# Patient Record
Sex: Female | Born: 1965 | Race: Black or African American | Hispanic: No | Marital: Married | Smoking: Never smoker
Health system: Southern US, Community
[De-identification: ages and names within clinical notes are randomized; demographics above are authoritative.]

## PROBLEM LIST (undated history)

## (undated) DIAGNOSIS — F419 Anxiety disorder, unspecified: Secondary | ICD-10-CM

## (undated) DIAGNOSIS — G43909 Migraine, unspecified, not intractable, without status migrainosus: Secondary | ICD-10-CM

## (undated) DIAGNOSIS — T783XXA Angioneurotic edema, initial encounter: Secondary | ICD-10-CM

## (undated) DIAGNOSIS — I10 Essential (primary) hypertension: Secondary | ICD-10-CM

## (undated) DIAGNOSIS — M7501 Adhesive capsulitis of right shoulder: Secondary | ICD-10-CM

## (undated) DIAGNOSIS — N819 Female genital prolapse, unspecified: Secondary | ICD-10-CM

## (undated) DIAGNOSIS — N3281 Overactive bladder: Secondary | ICD-10-CM

## (undated) DIAGNOSIS — F988 Other specified behavioral and emotional disorders with onset usually occurring in childhood and adolescence: Secondary | ICD-10-CM

## (undated) DIAGNOSIS — N393 Stress incontinence (female) (male): Secondary | ICD-10-CM

## (undated) DIAGNOSIS — M67911 Unspecified disorder of synovium and tendon, right shoulder: Secondary | ICD-10-CM

## (undated) DIAGNOSIS — R7303 Prediabetes: Secondary | ICD-10-CM

## (undated) HISTORY — PX: ABDOMINAL HYSTERECTOMY: SHX81

## (undated) HISTORY — DX: Angioneurotic edema, initial encounter: T78.3XXA

## (undated) HISTORY — PX: BUNIONECTOMY: SHX129

## (undated) HISTORY — PX: VAGINAL HYSTERECTOMY: SUR661

## (undated) HISTORY — PX: ABDOMINOPLASTY: SUR9

## (undated) HISTORY — DX: Migraine, unspecified, not intractable, without status migrainosus: G43.909

## (undated) HISTORY — PX: TONSILLECTOMY: SUR1361

## (undated) HISTORY — DX: Essential (primary) hypertension: I10

---

## 1987-07-13 HISTORY — PX: BREAST REDUCTION SURGERY: SHX8

## 1988-07-12 HISTORY — PX: VAGINAL HYSTERECTOMY: SUR661

## 1993-07-12 HISTORY — PX: BUNIONECTOMY: SHX129

## 1994-07-12 HISTORY — PX: TUBAL LIGATION: SHX77

## 1997-11-19 ENCOUNTER — Other Ambulatory Visit: Admission: RE | Admit: 1997-11-19 | Discharge: 1997-11-19 | Payer: Self-pay | Admitting: Obstetrics and Gynecology

## 1998-04-01 ENCOUNTER — Encounter: Admission: RE | Admit: 1998-04-01 | Discharge: 1998-04-01 | Payer: Self-pay | Admitting: Family Medicine

## 2003-04-03 ENCOUNTER — Inpatient Hospital Stay (HOSPITAL_COMMUNITY): Admission: RE | Admit: 2003-04-03 | Discharge: 2003-04-05 | Payer: Self-pay | Admitting: Obstetrics and Gynecology

## 2006-06-09 ENCOUNTER — Encounter: Admission: RE | Admit: 2006-06-09 | Discharge: 2006-06-09 | Payer: Self-pay | Admitting: Family Medicine

## 2008-07-23 LAB — HM MAMMOGRAPHY: HM Mammogram: NORMAL

## 2009-09-30 LAB — HM PAP SMEAR: HM Pap smear: NORMAL

## 2010-08-01 ENCOUNTER — Encounter: Payer: Self-pay | Admitting: Family Medicine

## 2010-12-15 ENCOUNTER — Other Ambulatory Visit: Payer: Self-pay | Admitting: Obstetrics and Gynecology

## 2010-12-15 ENCOUNTER — Other Ambulatory Visit (HOSPITAL_COMMUNITY)
Admission: RE | Admit: 2010-12-15 | Discharge: 2010-12-15 | Disposition: A | Discharge status: Home / Self Care | Payer: BC Managed Care – PPO | Source: Ambulatory Visit | Attending: Obstetrics and Gynecology | Admitting: Obstetrics and Gynecology

## 2010-12-15 DIAGNOSIS — Z01419 Encounter for gynecological examination (general) (routine) without abnormal findings: Secondary | ICD-10-CM | POA: Insufficient documentation

## 2011-01-20 ENCOUNTER — Encounter: Payer: Self-pay | Admitting: Family Medicine

## 2011-12-15 ENCOUNTER — Other Ambulatory Visit (HOSPITAL_COMMUNITY)
Admission: RE | Admit: 2011-12-15 | Discharge: 2011-12-15 | Disposition: A | Discharge status: Home / Self Care | Payer: BC Managed Care – PPO | Source: Ambulatory Visit | Attending: Obstetrics and Gynecology | Admitting: Obstetrics and Gynecology

## 2011-12-15 ENCOUNTER — Other Ambulatory Visit: Payer: Self-pay | Admitting: Obstetrics and Gynecology

## 2011-12-15 DIAGNOSIS — Z01419 Encounter for gynecological examination (general) (routine) without abnormal findings: Secondary | ICD-10-CM | POA: Insufficient documentation

## 2011-12-15 DIAGNOSIS — Z1159 Encounter for screening for other viral diseases: Secondary | ICD-10-CM | POA: Insufficient documentation

## 2012-09-13 ENCOUNTER — Other Ambulatory Visit: Payer: Self-pay | Admitting: Radiology

## 2013-04-12 ENCOUNTER — Other Ambulatory Visit (HOSPITAL_COMMUNITY)
Admission: RE | Admit: 2013-04-12 | Discharge: 2013-04-12 | Disposition: A | Discharge status: Home / Self Care | Payer: BC Managed Care – PPO | Source: Ambulatory Visit | Attending: Obstetrics and Gynecology | Admitting: Obstetrics and Gynecology

## 2013-04-12 ENCOUNTER — Other Ambulatory Visit: Payer: Self-pay | Admitting: Obstetrics and Gynecology

## 2013-04-12 DIAGNOSIS — Z01419 Encounter for gynecological examination (general) (routine) without abnormal findings: Secondary | ICD-10-CM | POA: Insufficient documentation

## 2013-10-31 ENCOUNTER — Other Ambulatory Visit: Payer: Self-pay | Admitting: Physician Assistant

## 2013-10-31 ENCOUNTER — Ambulatory Visit
Admission: RE | Admit: 2013-10-31 | Discharge: 2013-10-31 | Disposition: A | Discharge status: Home / Self Care | Payer: BC Managed Care – PPO | Source: Ambulatory Visit | Attending: Physician Assistant | Admitting: Physician Assistant

## 2013-10-31 DIAGNOSIS — S99911A Unspecified injury of right ankle, initial encounter: Secondary | ICD-10-CM

## 2014-04-04 ENCOUNTER — Ambulatory Visit: Payer: BC Managed Care – PPO | Admitting: Neurology

## 2014-04-04 ENCOUNTER — Telehealth: Payer: Self-pay | Admitting: Neurology

## 2014-04-04 ENCOUNTER — Encounter: Payer: Self-pay | Admitting: *Deleted

## 2014-04-04 NOTE — Telephone Encounter (Signed)
We can reschedule

## 2014-04-04 NOTE — Progress Notes (Signed)
No show letter sent for 04-03-14

## 2014-04-04 NOTE — Telephone Encounter (Signed)
Pt totally forgot about her new patient appt today and called at 9:14 and wanted to know if she still come in. i told her that we would need to see if we could resch please advise If this is ok to resch   [phone]

## 2014-04-04 NOTE — Telephone Encounter (Signed)
Pt no showed appt 04-04-14 and referring dr was notified by phone call

## 2014-05-09 ENCOUNTER — Encounter: Payer: Self-pay | Admitting: *Deleted

## 2014-05-14 ENCOUNTER — Ambulatory Visit (INDEPENDENT_AMBULATORY_CARE_PROVIDER_SITE_OTHER): Payer: BC Managed Care – PPO | Admitting: Neurology

## 2014-05-14 ENCOUNTER — Encounter: Payer: Self-pay | Admitting: Neurology

## 2014-05-14 VITALS — BP 112/72 | HR 84 | Ht 65.5 in | Wt 140.0 lb

## 2014-05-14 DIAGNOSIS — G4441 Drug-induced headache, not elsewhere classified, intractable: Secondary | ICD-10-CM

## 2014-05-14 DIAGNOSIS — G4440 Drug-induced headache, not elsewhere classified, not intractable: Secondary | ICD-10-CM

## 2014-05-14 DIAGNOSIS — G43709 Chronic migraine without aura, not intractable, without status migrainosus: Secondary | ICD-10-CM

## 2014-05-14 MED ORDER — PROPRANOLOL HCL ER 60 MG PO CP24
60.0000 mg | ORAL_CAPSULE | Freq: Every day | ORAL | Status: DC
Start: 2014-05-14 — End: 2018-03-08

## 2014-05-14 NOTE — Patient Instructions (Signed)
1.  We will start Inderal LA 60mg  daily.  Call in 4 weeks with update. 2.  Continue the topamax 200mg  for now. 3.  We will slowly taper down on the Excedrin:  Take no more than 5 days out of the week for 7 days  Then no more than 4 days out of the week for 7 days  Then no more than 3 days out of the week for 7 days  Then no more than 2 days out of the week 4.  Keep headache diary 5.  Follow the exercises described on the sleep hygiene sheet. 6.  Follow up in 3 months.  Call in 4 weeks with update of headaches and we can adjust the Inderal if needed.

## 2014-05-14 NOTE — Progress Notes (Signed)
NEUROLOGY CONSULTATION NOTE  [name] MRN: [id number] DOB: 01/12/1966  Referring provider: Dr. Clelia Croft Primary care provider: Dr. Clelia Croft  Reason for consult:  Headache  HISTORY OF PRESENT ILLNESS: Leah Khan is a 48 year old right-handed woman with history of hypertension, depression and headache who presents for migraines.  Onset:  High school Location:  Varies.  Usually bi-temporal or bi-frontal but sometimes also in the back of the head Quality:  pounding Intensity:  7-10/10 Aura:  no Prodrome:  no Associated symptoms:  Nausea, photophobia, phonophobia, osmophobia.  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)  Past abortive therapy:  Migranal NS, Cambia, Tylenol, sumatriptan, Maxalt, Frova, Midrin, Zomig po and NS, ibuprofen, naproxen, diclofenac, baclofen, cyclobenzaprine, Skelaxin, tizanidine, metoclopramide, tramadol Past preventative therapy:  zonisamide 100mg , gabapentin, atenolol, Cymbalta, imipramine, Zoloft (side effects), amitriptyline (side effects), trigger point injections  Current abortive therapy:  Excedrin (takes 20 days per month) Current preventative therapy:  topiramate 200mg   Other medications:  Ambien 0.25 of 12.5mg  tablet, lisinopril  Caffeine:  no Alcohol:  no Smoker:  no Diet:  Increased water.  No pork or soda.  Little sweets Exercise:  routinely Depression/stress:  no Sleep hygiene:  Poor.  Wakes up and cannot fall back to sleep.  Sleeps well with 1/4 tablet of Ambien Family history of headache:  Maternal grandfather  06/09/06 MRI BRAIN W/WO:  right frontal venous angioma and punctate focus of hyperintensity in the  left parietal white matter, both incidental findings.  PAST MEDICAL HISTORY: Past Medical History  Diagnosis Date  . Migraine   . Hypertension     PAST SURGICAL HISTORY: Past Surgical History  Procedure Laterality Date  . Abdominal hysterectomy    . Bunionectomy      MEDICATIONS: Current Outpatient Prescriptions on File Prior to Visit  Medication Sig Dispense Refill  . lisinopril-hydrochlorothiazide (PRINZIDE,ZESTORETIC) 20-12.5 MG per tablet Take 1 tablet by mouth daily.      Marland Kitchen topiramate (TOPAMAX) 200 MG tablet Take 200 mg by mouth daily.     Marland Kitchen zolpidem (AMBIEN CR) 12.5 MG CR tablet Take 12.5 mg by mouth at bedtime as needed.       No current facility-administered medications on file prior to visit.    ALLERGIES: Allergies  Allergen Reactions  . Amitriptyline     Irritability   . Sertraline Hcl     Zombie feeling     FAMILY HISTORY: History reviewed. No pertinent family history.  SOCIAL HISTORY: History   Social History  . Marital Status: Married    Spouse Name: N/A    Number of Children: N/A  . Years of Education: N/A   Occupational History  . Not on file.   Social History Main Topics  . Smoking status: Never Smoker   . Smokeless tobacco: Not on file  . Alcohol Use: No  . Drug Use: No  . Sexual Activity: Not on file   Other Topics Concern  . Not on file   Social History Narrative    REVIEW OF SYSTEMS: Constitutional: No fevers, chills, or sweats, no generalized fatigue, change in appetite Eyes: No visual changes, double vision, eye pain Ear, nose and throat: No hearing loss, ear  pain, nasal congestion, sore throat Cardiovascular: No chest pain, palpitations Respiratory:  No shortness of breath at rest or with exertion, wheezes GastrointestinaI: No nausea, vomiting, diarrhea, abdominal pain, fecal incontinence Genitourinary:  No dysuria, urinary retention or frequency Musculoskeletal:  No neck pain, back pain Integumentary: No rash, pruritus,  skin lesions Neurological: as above Psychiatric: Insomnia Endocrine: No palpitations, fatigue, diaphoresis, mood swings, change in appetite, change in weight, increased thirst Hematologic/Lymphatic:  No anemia, purpura, petechiae. Allergic/Immunologic: no itchy/runny eyes, nasal congestion, recent allergic reactions, rashes  PHYSICAL EXAM: Filed Vitals:   05/14/14 1244  BP: 112/72  Pulse: 84   General: No acute distress Head:  Normocephalic/atraumatic Neck: supple, no paraspinal tenderness, full range of motion Back: No paraspinal tenderness Heart: regular rate and rhythm Lungs: Clear to auscultation bilaterally. Vascular: No carotid bruits. Neurological Exam: Mental status: alert and oriented to person, place, and time, recent and remote memory intact, fund of knowledge intact, attention and concentration intact, speech fluent and not dysarthric, language intact. Cranial nerves: CN I: not tested CN II: pupils equal, round and reactive to light, visual fields intact, fundi unremarkable, without vessel changes, exudates, hemorrhages or papilledema. CN III, IV, VI:  full range of motion, no nystagmus, no ptosis CN V: facial sensation intact CN VII: upper and lower face symmetric CN VIII: hearing intact CN IX, X: gag intact, uvula midline CN XI: sternocleidomastoid and trapezius muscles intact CN XII: tongue midline Bulk & Tone: normal, no fasciculations. Motor: 5/5 throughout Sensation:  Temperature and vibration intact Deep Tendon Reflexes: 2+ throughout, toes downgoing Finger to nose testing: no dysmetria Heel to shin:  No dysmetria Gait:  Normal station and stride.  Able to turn and walk in tandem. Romberg negative.  IMPRESSION: Chronic migraine without aura Medication overuse headache  PLAN: 1.  Start Inderal LA 60mg  daily.  Continue topamax 200mg  2.  Taper down Excedrin by one day per week to no more than 2 days out of the week 3.  Headache diary 4.  Follow  exercises for improved sleep hygiene 5.  Call in 4 weeks.  Follow up in 3 months.  Thank you for allowing me to take part in the care of this patient.  Shon Millet, DO  CC:  Lupita Raider, MD

## 2014-06-11 ENCOUNTER — Telehealth: Payer: Self-pay | Admitting: *Deleted

## 2014-06-11 ENCOUNTER — Telehealth: Payer: Self-pay | Admitting: Neurology

## 2014-06-11 NOTE — Telephone Encounter (Signed)
Patient states she does not like the way the Inderal makes her feel she feels like she is gaining weight and has been having nightmares . Patient also states she has not noticed that the medication is really  Not helping with the headaches .Please advise

## 2014-06-11 NOTE — Telephone Encounter (Signed)
Pt called to give a up date on medication she states that she does not like the medication please call pt at [phone]

## 2014-06-11 NOTE — Telephone Encounter (Signed)
venlafaxine ER 37.5mg  daily for 1 week, then 75mg  daily for 1 week, then 112.5mg  daily for 1 week, then 150mg  daily. patient is aware

## 2014-06-11 NOTE — Telephone Encounter (Signed)
She can stop Inderal.  Instead, we can try venlafaxine ER 37.5mg  daily for 1 week, then 75mg  daily for 1 week, then 112.5mg  daily for 1 week, then 150mg  daily.

## 2014-06-13 ENCOUNTER — Telehealth: Payer: Self-pay | Admitting: Neurology

## 2014-06-13 NOTE — Telephone Encounter (Signed)
Pt called wanting to speak to a nurse regarding ESSEXOR  C/b [phone]

## 2014-06-13 NOTE — Telephone Encounter (Signed)
Patient called stating she does not want to take the effexor she would rather stay away from anti depressants.Please advise

## 2014-06-13 NOTE — Telephone Encounter (Signed)
If she doesn't want to take any antidepressants, then that really narrows the options.  I would then recommend Depakote ER 500mg  at bedtime.  We should get a baseline CBC and CMP

## 2014-06-14 ENCOUNTER — Telehealth: Payer: Self-pay | Admitting: *Deleted

## 2014-06-14 NOTE — Telephone Encounter (Signed)
Patient will come to pick up labs at office Tuesday 06/18/14 then we will start her on Depakote ER

## 2014-06-17 ENCOUNTER — Other Ambulatory Visit: Payer: Self-pay | Admitting: *Deleted

## 2014-06-17 DIAGNOSIS — G43009 Migraine without aura, not intractable, without status migrainosus: Secondary | ICD-10-CM

## 2014-06-19 LAB — CBC WITH DIFFERENTIAL/PLATELET
BASOS ABS: 0 10*3/uL (ref 0.0–0.1)
Basophils Relative: 0 % (ref 0–1)
Eosinophils Absolute: 0.1 10*3/uL (ref 0.0–0.7)
Eosinophils Relative: 1 % (ref 0–5)
HCT: 36.8 % (ref 36.0–46.0)
HEMOGLOBIN: 12.7 g/dL (ref 12.0–15.0)
LYMPHS ABS: 4 10*3/uL (ref 0.7–4.0)
LYMPHS PCT: 48 % — AB (ref 12–46)
MCH: 28.5 pg (ref 26.0–34.0)
MCHC: 34.5 g/dL (ref 30.0–36.0)
MCV: 82.7 fL (ref 78.0–100.0)
MONO ABS: 0.5 10*3/uL (ref 0.1–1.0)
MPV: 10.3 fL (ref 9.4–12.4)
Monocytes Relative: 6 % (ref 3–12)
NEUTROS ABS: 3.8 10*3/uL (ref 1.7–7.7)
Neutrophils Relative %: 45 % (ref 43–77)
PLATELETS: 265 10*3/uL (ref 150–400)
RBC: 4.45 MIL/uL (ref 3.87–5.11)
RDW: 13.9 % (ref 11.5–15.5)
WBC: 8.4 10*3/uL (ref 4.0–10.5)

## 2014-06-20 LAB — COMPREHENSIVE METABOLIC PANEL
ALK PHOS: 60 U/L (ref 39–117)
ALT: 10 U/L (ref 0–35)
AST: 19 U/L (ref 0–37)
Albumin: 4.3 g/dL (ref 3.5–5.2)
BILIRUBIN TOTAL: 0.4 mg/dL (ref 0.2–1.2)
BUN: 15 mg/dL (ref 6–23)
CHLORIDE: 106 meq/L (ref 96–112)
CO2: 21 mEq/L (ref 19–32)
CREATININE: 0.92 mg/dL (ref 0.50–1.10)
Calcium: 9.3 mg/dL (ref 8.4–10.5)
Glucose, Bld: 82 mg/dL (ref 70–99)
Potassium: 3.1 mEq/L — ABNORMAL LOW (ref 3.5–5.3)
Sodium: 136 mEq/L (ref 135–145)
Total Protein: 7 g/dL (ref 6.0–8.3)

## 2014-08-13 ENCOUNTER — Telehealth: Payer: Self-pay | Admitting: Neurology

## 2014-08-13 NOTE — Telephone Encounter (Signed)
Pt canceled her f/u for tomorrow 08/14/14. Pt will call later to r/s.

## 2014-08-14 ENCOUNTER — Ambulatory Visit: Payer: BC Managed Care – PPO | Admitting: Neurology

## 2015-04-30 ENCOUNTER — Ambulatory Visit (INDEPENDENT_AMBULATORY_CARE_PROVIDER_SITE_OTHER): Payer: BC Managed Care – PPO | Admitting: Allergy and Immunology

## 2015-04-30 ENCOUNTER — Encounter: Payer: Self-pay | Admitting: Allergy and Immunology

## 2015-04-30 VITALS — BP 120/80 | HR 60 | Temp 97.9°F | Resp 12 | Ht 64.57 in | Wt 144.4 lb

## 2015-04-30 DIAGNOSIS — T783XXA Angioneurotic edema, initial encounter: Secondary | ICD-10-CM

## 2015-04-30 DIAGNOSIS — J3089 Other allergic rhinitis: Secondary | ICD-10-CM

## 2015-04-30 HISTORY — DX: Angioneurotic edema, initial encounter: T78.3XXA

## 2015-04-30 MED ORDER — FLUTICASONE PROPIONATE 50 MCG/ACT NA SUSP: 2.0000 | Freq: Every day | NASAL | Status: DC

## 2015-04-30 NOTE — Patient Instructions (Addendum)
Angioedema Angioedema versus allergic reactions.  Select food allergen skin tests were negative today despite a positive histamine control.  Leah Khan had been on an ACE inhibitor at time of symptom onset, however discontinued this medication.  It is not uncommon for stuttering angioedema to occur in the weeks or months following discontinuation of an ACE inhibitor.  We will check labs to rule out other etiologies, however if these labs are unrevealing, angioedema secondary to ace inhibition is most likely etiology and the episodes of angioedema edema would be expected to diminish in the upcoming weeks/months.  The following labs have been ordered: C4, C1 esterase inhibitor (quantitative and functional), C1q, factor XII, galactose-alpha-1,3-galactose IgE level, and baseline tryptase level. An additional order has been provided for tryptase to be drawn within 4 hours of symptom onset to rule out anaphylaxis.  Should symptoms recur, a journal is to be kept recording any foods eaten, beverages consumed, and medications taken within a 6 hour time period prior to the onset of symptoms, as well as record activities being performed, and environmental conditions. For any symptoms concerning for anaphylaxis, 911 is to be called immediately.  Allergic rhinitis Perennial allergic rhinitis.  Aeroallergen avoidance measures have been discussed and provided in written form.  A prescription has been provided for fluticasone nasal spray, one spray per nostril 1-2 times daily as needed. Proper nasal spray technique has been discussed and demonstrated.    When lab results have returned, the patient will be called with further recommendations and follow up instructions.  Control of House Dust Mite Allergen  House dust mites play a major role in allergic asthma and rhinitis.  They occur in environments with high humidity wherever human skin, the food for dust mites is found. High levels have been detected in dust  obtained from mattresses, pillows, carpets, upholstered furniture, bed covers, clothes and soft toys.  The principal allergen of the house dust mite is found in its feces.  A gram of dust may contain 1,000 mites and 250,000 fecal particles.  Mite antigen is easily measured in the air during house cleaning activities.    1. Encase mattresses, including the box spring, and pillow, in an air tight cover.  Seal the zipper end of the encased mattresses with wide adhesive tape. 2. Wash the bedding in water of 130 degrees Farenheit weekly.  Avoid cotton comforters/quilts and flannel bedding: the most ideal bed covering is the dacron comforter. 3. Remove all upholstered furniture from the bedroom. 4. Remove carpets, carpet padding, rugs, and non-washable window drapes from the bedroom.  Wash drapes weekly or use plastic window coverings. 5. Remove all non-washable stuffed toys from the bedroom.  Wash stuffed toys weekly. 6. Have the room cleaned frequently with a vacuum cleaner and a damp dust-mop.  The patient should not be in a room which is being cleaned and should wait 1 hour after cleaning before going into the room. 7. Close and seal all heating outlets in the bedroom.  Otherwise, the room will become filled with dust-laden air.  An electric heater can be used to heat the room. 8. Reduce indoor humidity to less than 50%.  Do not use a humidifier.

## 2015-04-30 NOTE — Progress Notes (Signed)
History of present illness: HPI Comments: Leah Khan is a 49 y.o. female presents today for initial consultation.  Over the past 2 months, she has experienced 5 episodes of lip swelling.  She had been on an ACE inhibitor over the past 5 years and this medication was discontinued shortly after the first episode of lip swelling.  She denies concomitant cardiopulmonary or GI symptoms.  She is unaware of any family history of angioedema.  No specific food or environmental triggers have been identified, however she reports that she has been eating more peanuts recently.  She has eliminated all nuts from her diet over the past week.    Assessment and plan: Angioedema Angioedema versus allergic reactions.  Select food allergen skin tests were negative today despite a positive histamine control.  Leah Khan had been on an ACE inhibitor at time of symptom onset, however discontinued this medication.  It is not uncommon for stuttering angioedema to occur in the weeks or months following discontinuation of an ACE inhibitor.  We will check labs to rule out other etiologies, however if these labs are unrevealing, angioedema secondary to ace inhibition is most likely etiology and the episodes of angioedema edema would be expected to diminish in the upcoming weeks/months.  The following labs have been ordered: C4, C1 esterase inhibitor (quantitative and functional), C1q, factor XII, galactose-alpha-1,3-galactose IgE level, and baseline tryptase level. An additional order has been provided for tryptase to be drawn within 4 hours of symptom onset to rule out anaphylaxis.  Should symptoms recur, a journal is to be kept recording any foods eaten, beverages consumed, and medications taken within a 6 hour time period prior to the onset of symptoms, as well as record activities being performed, and environmental conditions. For any symptoms concerning for anaphylaxis, 911 is to be called immediately.  Allergic  rhinitis Perennial allergic rhinitis.  Aeroallergen avoidance measures have been discussed and provided in written form.  A prescription has been provided for fluticasone nasal spray, one spray per nostril 1-2 times daily as needed. Proper nasal spray technique has been discussed and demonstrated.    Medications ordered this encounter: Meds ordered this encounter  Medications  . fluticasone (FLONASE) 50 MCG/ACT nasal spray    Sig: Place 2 sprays into both nostrils daily.    Dispense:  1 g    Refill:  5    Diagnositics: Environmental skin testing: Positive to dust mite antigen. Food allergen skin testing: Negative despite a positive histamine control.    Physical examination: Blood pressure 120/80, pulse 60, temperature 97.9 F (36.6 C), temperature source Oral, resp. rate 12, height 5' 4.57" (1.64 m), weight 144 lb 6.4 oz (65.5 kg).  General: Alert, interactive, in no acute distress. HEENT: TMs pearly gray, turbinates moderately edematous and pale without discharge, post-pharynx moderately erythematous. Neck: Supple without lymphadenopathy. Lungs: Clear to auscultation without wheezing, rhonchi or rales. CV: Normal S1, S2 without murmurs. Skin: Warm and dry, without lesions or rashes. Extremities:  No clubbing, cyanosis or edema. Neuro:   Grossly intact.  Review of systems: Review of Systems  Constitutional: Negative for fever, chills and weight loss.  HENT: Negative for nosebleeds.   Eyes: Negative for blurred vision.  Respiratory: Negative for hemoptysis, shortness of breath and wheezing.   Cardiovascular: Negative for chest pain.  Gastrointestinal: Negative for diarrhea and constipation.  Genitourinary: Negative for dysuria.  Musculoskeletal: Negative for myalgias and joint pain.  Neurological: Negative for dizziness.  Endo/Heme/Allergies: Does not bruise/bleed easily.    Past  medical history: Past Medical History  Diagnosis Date  . Migraine   . Hypertension    . Angioedema 04/30/2015    Past surgical history: Past Surgical History  Procedure Laterality Date  . Abdominal hysterectomy    . Bunionectomy    . Tonsillectomy      Family history: Family History  Problem Relation Age of Onset  . Alcoholism Father   . Diabetes Maternal Grandmother     Social history: Social History   Social History  . Marital Status: Married    Spouse Name: N/A  . Number of Children: N/A  . Years of Education: N/A   Occupational History  . Not on file.   Social History Main Topics  . Smoking status: Never Smoker   . Smokeless tobacco: Never Used  . Alcohol Use: No  . Drug Use: No  . Sexual Activity: Not on file   Other Topics Concern  . Not on file   Social History Narrative   Environmental History:  Leah Khan lives in a 49 year old house with carpeting in the bedroom and central air/heat.  There is a dog in house which has access to her bedroom.  There are no smokers in the household.  Known medication allergies: Allergies  Allergen Reactions  . Amitriptyline     Irritability   . Sertraline Hcl     Zombie feeling     Outpatient medications:   Medication List       This list is accurate as of: 04/30/15 12:52 PM.  Always use your most recent med list.               amLODipine 5 MG tablet  Commonly known as:  NORVASC  Take 5 mg by mouth daily.     fluticasone 50 MCG/ACT nasal spray  Commonly known as:  FLONASE  Place 2 sprays into both nostrils daily.     lisinopril-hydrochlorothiazide 20-12.5 MG tablet  Commonly known as:  PRINZIDE,ZESTORETIC  Take 1 tablet by mouth daily.     multivitamin tablet  Take 1 tablet by mouth daily.     propranolol ER 60 MG 24 hr capsule  Commonly known as:  INDERAL LA  Take 1 capsule (60 mg total) by mouth daily.     topiramate 100 MG tablet  Commonly known as:  TOPAMAX  Take 100 mg by mouth daily.     topiramate 200 MG tablet  Commonly known as:  TOPAMAX  Take 200 mg by mouth daily.      zolpidem 12.5 MG CR tablet  Commonly known as:  AMBIEN CR  Take 12.5 mg by mouth at bedtime as needed.        I appreciate the opportunity to take part in this Delana's care. Please do not hesitate to contact me with questions.  Sincerely,   R. Jorene Guest, MD

## 2015-04-30 NOTE — Assessment & Plan Note (Signed)
Perennial allergic rhinitis.  Aeroallergen avoidance measures have been discussed and provided in written form.  A prescription has been provided for fluticasone nasal spray, one spray per nostril 1-2 times daily as needed. Proper nasal spray technique has been discussed and demonstrated.

## 2015-04-30 NOTE — Assessment & Plan Note (Addendum)
Angioedema versus allergic reactions.  Select food allergen skin tests were negative today despite a positive histamine control.  Leah Khan had been on an ACE inhibitor at time of symptom onset, however discontinued this medication.  It is not uncommon for stuttering angioedema to occur in the weeks or months following discontinuation of an ACE inhibitor.  We will check labs to rule out other etiologies, however if these labs are unrevealing, angioedema secondary to ace inhibition is most likely etiology and the episodes of angioedema edema would be expected to diminish in the upcoming weeks/months.  The following labs have been ordered: C4, C1 esterase inhibitor (quantitative and functional), C1q, factor XII, galactose-alpha-1,3-galactose IgE level, and baseline tryptase level. An additional order has been provided for tryptase to be drawn within 4 hours of symptom onset to rule out anaphylaxis.  Should symptoms recur, a journal is to be kept recording any foods eaten, beverages consumed, and medications taken within a 6 hour time period prior to the onset of symptoms, as well as record activities being performed, and environmental conditions. For any symptoms concerning for anaphylaxis, 911 is to be called immediately.

## 2015-05-07 ENCOUNTER — Other Ambulatory Visit: Payer: Self-pay | Admitting: Allergy and Immunology

## 2015-05-08 LAB — C4 COMPLEMENT: C4 Complement: 21 mg/dL (ref 10–40)

## 2015-05-09 LAB — TRYPTASE: Tryptase: 4.9 ug/L (ref <11)

## 2015-05-09 LAB — C1 ESTERASE INHIBITOR, FUNCTIONAL: C1INH Functional/C1INH Total MFr SerPl: >100 % (ref >=68)

## 2015-05-13 LAB — COMPLEMENT COMPONENT C1Q: Complement C1Q: 6.1 mg/dL (ref 5.0–8.6)

## 2015-05-14 LAB — ALPHA-GAL PANEL
Allergen, Mutton, f88: <0.1 kU/L
Allergen, Pork, f26: <0.1 kU/L
Beef: <0.1 kU/L
Galactose-alpha-1,3-galactose IgE: <0.1 kU/L (ref <0.35)

## 2015-05-20 ENCOUNTER — Telehealth: Payer: Self-pay | Admitting: Allergy and Immunology

## 2015-05-20 NOTE — Telephone Encounter (Signed)
T/C TO PATIENT ADVISED LABS NEGATIVE AND SHE ADVISED LIP SWELLING REACTION LAST NIGHT APPROX 1 AM NOT FOODS EATEN 6 HRS PRIOR.  WILL KEEP A LOG AND PER DR BOBBITT CAN TRY OTC ZYRTEC AND RANITIDINE TO SEE IF WILL LESSEN THE SX.  REPORT BACK TO US IN COUPLE WEEKS HOW SHE IS DOING

## 2015-05-20 NOTE — Telephone Encounter (Signed)
Pt called seeking lab results from bloodwork taken 2 weeks ago Pls Advise

## 2015-05-29 ENCOUNTER — Other Ambulatory Visit: Payer: Self-pay | Admitting: Obstetrics and Gynecology

## 2015-12-05 ENCOUNTER — Other Ambulatory Visit: Payer: Self-pay | Admitting: Family Medicine

## 2015-12-05 DIAGNOSIS — R1900 Intra-abdominal and pelvic swelling, mass and lump, unspecified site: Secondary | ICD-10-CM

## 2015-12-12 ENCOUNTER — Ambulatory Visit
Admission: RE | Admit: 2015-12-12 | Discharge: 2015-12-12 | Disposition: A | Discharge status: Home / Self Care | Payer: BC Managed Care – PPO | Source: Ambulatory Visit | Attending: Family Medicine | Admitting: Family Medicine

## 2015-12-12 ENCOUNTER — Other Ambulatory Visit: Payer: Self-pay | Admitting: Family Medicine

## 2015-12-12 DIAGNOSIS — R1900 Intra-abdominal and pelvic swelling, mass and lump, unspecified site: Secondary | ICD-10-CM

## 2016-02-10 HISTORY — PX: ABDOMINOPLASTY: SUR9

## 2016-07-12 HISTORY — PX: INCISIONAL HERNIA REPAIR: SHX193

## 2016-09-03 ENCOUNTER — Ambulatory Visit (INDEPENDENT_AMBULATORY_CARE_PROVIDER_SITE_OTHER): Payer: BC Managed Care – PPO | Admitting: Orthopaedic Surgery

## 2016-09-03 ENCOUNTER — Ambulatory Visit (INDEPENDENT_AMBULATORY_CARE_PROVIDER_SITE_OTHER): Payer: BC Managed Care – PPO

## 2016-09-03 ENCOUNTER — Encounter (INDEPENDENT_AMBULATORY_CARE_PROVIDER_SITE_OTHER): Payer: Self-pay | Admitting: Orthopaedic Surgery

## 2016-09-03 DIAGNOSIS — M25511 Pain in right shoulder: Secondary | ICD-10-CM

## 2016-09-03 MED ORDER — DICLOFENAC SODIUM 75 MG PO TBEC: 75.0000 mg | DELAYED_RELEASE_TABLET | Freq: Two times a day (BID) | ORAL | 2 refills | Status: DC

## 2016-09-03 NOTE — Progress Notes (Signed)
Office Visit Note   Patient: Leah Khan           Date of Birth: February 26, 1966           MRN: [id number] Visit Date: 09/03/2016              Requested by: Lupita Raider, MD [address] [place], [place] [address] PCP: Lupita Raider, MD   Assessment & Plan: Visit Diagnoses:  1. Acute pain of right shoulder     Plan: Right frozen shoulder.  Recommend PT, intraarticular steroid injection 6 weeks apart.  Diclofenac prn.  Frozen shoulder print out given today.  F/u 6 weeks for recheck.  Would recommend not doing resistance exercises with shoulder for now.  Follow-Up Instructions: Return in about 6 weeks (around 10/15/2016) for recheck frozen shoulder.   Orders:  Orders Placed This Encounter  Procedures  . XR Shoulder Right  . DG INJECT DIAG/THERA/INC NEEDLE/CATH/PLC EPI/CERV/THOR W/IMG   Meds ordered this encounter  Medications  . diclofenac (VOLTAREN) 75 MG EC tablet    Sig: Take 1 tablet (75 mg total) by mouth 2 (two) times daily.    Dispense:  30 tablet    Refill:  2      Procedures: No procedures performed   Clinical Data: No additional findings.   Subjective: Chief Complaint  Patient presents with  . Right Shoulder - Pain    Elodie comes in today for right shoulder pain of insidious onset over the last couple of months.  She does lift weights at the gym.  She's RHD.  Having trouble unhooking her bra and reaching across.  Denies any radicular pain.  Hurts to sleep on right shoulder.      Review of Systems  Constitutional: Negative.   HENT: Negative.   Eyes: Negative.   Respiratory: Negative.   Cardiovascular: Negative.   Endocrine: Negative.   Musculoskeletal: Negative.   Neurological: Negative.   Hematological: Negative.   Psychiatric/Behavioral: Negative.   All other systems reviewed and are negative.    Objective: Vital Signs: There were no vitals taken for this visit.  Physical Exam  Constitutional: She is oriented to person,  place, and time. She appears well-developed and well-nourished.  HENT:  Head: Normocephalic and atraumatic.  Eyes: EOM are normal.  Neck: Neck supple.  Pulmonary/Chest: Effort normal.  Abdominal: Soft.  Neurological: She is alert and oriented to person, place, and time.  Skin: Skin is warm. Capillary refill takes less than 2 seconds.  Psychiatric: She has a normal mood and affect. Her behavior is normal. Judgment and thought content normal.  Nursing note and vitals reviewed.   Ortho Exam Right shoulder - limited FF, ER, IR and with pain at limits of ROM - RC gross normal  Specialty Comments:  No specialty comments available.  Imaging: No results found.   PMFS History: Patient Active Problem List   Diagnosis Date Noted  . Angioedema 04/30/2015  . Allergic rhinitis 04/30/2015   Past Medical History:  Diagnosis Date  . Angioedema 04/30/2015  . Hypertension   . Migraine     Family History  Problem Relation Age of Onset  . Alcoholism Father   . Diabetes Maternal Grandmother     Past Surgical History:  Procedure Laterality Date  . ABDOMINAL HYSTERECTOMY    . BUNIONECTOMY    . TONSILLECTOMY     Social History   Occupational History  . Not on file.   Social History Main Topics  . Smoking status:  Never Smoker  . Smokeless tobacco: Never Used  . Alcohol use No  . Drug use: No  . Sexual activity: Not on file

## 2016-10-05 ENCOUNTER — Telehealth (INDEPENDENT_AMBULATORY_CARE_PROVIDER_SITE_OTHER): Payer: Self-pay | Admitting: Orthopaedic Surgery

## 2016-10-05 DIAGNOSIS — M25511 Pain in right shoulder: Principal | ICD-10-CM

## 2016-10-05 DIAGNOSIS — G8929 Other chronic pain: Secondary | ICD-10-CM

## 2016-10-05 NOTE — Telephone Encounter (Signed)
Please advise 

## 2016-10-05 NOTE — Telephone Encounter (Signed)
Lets get her in with [facility]

## 2016-10-05 NOTE — Telephone Encounter (Signed)
[facility] in her shoulder and she's not heard anything from them. She's just wondering what needed to be done. CB # [phone]

## 2016-10-05 NOTE — Telephone Encounter (Signed)
Called patient to let her know we will submit order for inj with Dr Alvester MorinNewton

## 2016-10-05 NOTE — Addendum Note (Signed)
Addended by: Albertina ParrGARCIA, Romilda Proby on: 10/05/2016 04:28 PM   Modules accepted: Orders

## 2016-10-14 ENCOUNTER — Ambulatory Visit (INDEPENDENT_AMBULATORY_CARE_PROVIDER_SITE_OTHER): Payer: BC Managed Care – PPO | Admitting: Physical Medicine and Rehabilitation

## 2016-10-14 ENCOUNTER — Ambulatory Visit (INDEPENDENT_AMBULATORY_CARE_PROVIDER_SITE_OTHER): Payer: BC Managed Care – PPO

## 2016-10-14 ENCOUNTER — Encounter (INDEPENDENT_AMBULATORY_CARE_PROVIDER_SITE_OTHER): Payer: Self-pay | Admitting: Physical Medicine and Rehabilitation

## 2016-10-14 VITALS — BP 122/76 | HR 64

## 2016-10-14 DIAGNOSIS — M25511 Pain in right shoulder: Secondary | ICD-10-CM

## 2016-10-14 DIAGNOSIS — G8929 Other chronic pain: Secondary | ICD-10-CM | POA: Diagnosis not present

## 2016-10-14 DIAGNOSIS — M7501 Adhesive capsulitis of right shoulder: Secondary | ICD-10-CM

## 2016-10-14 NOTE — Patient Instructions (Signed)

## 2016-10-14 NOTE — Progress Notes (Signed)
ADALIE HANBERRY - 51 y.o. female MRN [id number]  Date of birth: Sep 29, 1965  Office Visit Note: Visit Date: 10/14/2016 PCP: Lupita Raider, MD Referred by: Lupita Raider, MD  Subjective: Chief Complaint  Patient presents with  . Right Shoulder - Pain   HPI: Mrs. Warburton is a pleasant 51 year old female with complaints of right shoulder pain and decreased range of motion for several months. She reports worsening over time and inability to lift arm overhead. Pain down arm to elbow. Dr. Roda Shutters request diagnostic and therapeutic anesthetic glenohumeral joint arthrogram for adhesive capsulitis.    ROS Otherwise per HPI.  Assessment & Plan: Visit Diagnoses:  1. Chronic right shoulder pain   2. Adhesive capsulitis of right shoulder     Plan: Findings:  Right anesthetic glenohumeral joint arthrogram. Patient had significant relief of her symptoms after the injection with significant increased range of motion.    Meds & Orders: No orders of the defined types were placed in this encounter.   Orders Placed This Encounter  Procedures  . Large Joint Injection/Arthrocentesis  . XR C-ARM NO REPORT    Follow-up: Return if symptoms worsen or fail to improve, for Dr. Roda Shutters.   Procedures: Large Joint Inj Date/Time: 10/14/2016 2:37 PM Performed by: Tyrell Antonio Authorized by: Tyrell Antonio   Consent Given by:  Patient Site marked: the procedure site was marked   Timeout: prior to procedure the correct patient, procedure, and site was verified   Indications:  Pain and diagnostic evaluation Location:  Shoulder Site:  R glenohumeral Prep: patient was prepped and draped in usual sterile fashion   Needle Size:  22 G Needle Length:  3.5 inches Approach:  Anteromedial Ultrasound Guidance: No   Fluoroscopic Guidance: No   Arthrogram: Yes   Medications:  80 mg triamcinolone acetonide 40 MG/ML; 3 mL bupivacaine 0.5 % Aspiration Attempted: No   Patient tolerance:  Patient tolerated the  procedure well with no immediate complications  Arthrogram demonstrated excellent flow of contrast throughout the joint surface without extravasation or obvious defect.  The patient had relief of symptoms during the anesthetic phase of the injection.     No notes on file   Clinical History: No specialty comments available.  She reports that she has never smoked. She has never used smokeless tobacco. No results for input(s): HGBA1C, LABURIC in the last 8760 hours.  Objective:  VS:  HT:    WT:   BMI:     BP:122/76  HR:64bpm  TEMP: ( )  RESP:  Physical Exam  Musculoskeletal:  Significant decreased range of motion particularly with abduction and external rotation of the right shoulder compared to left.    Ortho Exam Imaging: No results found.  Past Medical/Family/Surgical/Social History: Medications & Allergies reviewed per EMR Patient Active Problem List   Diagnosis Date Noted  . Chronic right shoulder pain 10/18/2016  . Angioedema 04/30/2015  . Allergic rhinitis 04/30/2015   Past Medical History:  Diagnosis Date  . Angioedema 04/30/2015  . Hypertension   . Migraine    Family History  Problem Relation Age of Onset  . Alcoholism Father   . Diabetes Maternal Grandmother    Past Surgical History:  Procedure Laterality Date  . ABDOMINAL HYSTERECTOMY    . BUNIONECTOMY    . TONSILLECTOMY     Social History   Occupational History  . Not on file.   Social History Main Topics  . Smoking status: Never Smoker  . Smokeless tobacco: Never Used  . Alcohol use  No  . Drug use: No  . Sexual activity: Not on file

## 2016-10-18 ENCOUNTER — Ambulatory Visit (INDEPENDENT_AMBULATORY_CARE_PROVIDER_SITE_OTHER): Payer: BC Managed Care – PPO | Admitting: Orthopaedic Surgery

## 2016-10-18 DIAGNOSIS — G8929 Other chronic pain: Secondary | ICD-10-CM

## 2016-10-18 DIAGNOSIS — M25511 Pain in right shoulder: Secondary | ICD-10-CM

## 2016-10-18 MED ORDER — TRIAMCINOLONE ACETONIDE 40 MG/ML IJ SUSP
80.0000 mg | INTRAMUSCULAR | Status: AC | PRN
  Administered 2016-10-14: 80 mg via INTRA_ARTICULAR

## 2016-10-18 MED ORDER — BUPIVACAINE HCL 0.5 % IJ SOLN
3.0000 mL | INTRAMUSCULAR | Status: AC | PRN
  Administered 2016-10-14: 3 mL via INTRA_ARTICULAR

## 2016-10-18 NOTE — Progress Notes (Signed)
Office Visit Note   Patient: Leah Khan           Date of Birth: 1965-10-18           MRN: [id number] Visit Date: 10/18/2016              Requested by: Lupita Raider, MD [address] [place], [place] [address] PCP: Lupita Raider, MD   Assessment & Plan: Visit Diagnoses:  1. Chronic right shoulder pain     Plan: I will like her to get another steroid injection with Dr. Alvester Morin in about 6 weeks. In the meantime continue with physical therapy and home exercise and stretching. She may begin strengthening when she has full painless range of motion. Follow-up with me as needed. If she does not improve in any significant manner then we would need to obtain MRI to rule out structural abnormality. Questions encouraged and answered.  Follow-Up Instructions: Return if symptoms worsen or fail to improve.   Orders:  No orders of the defined types were placed in this encounter.  No orders of the defined types were placed in this encounter.     Procedures: No procedures performed   Clinical Data: No additional findings.   Subjective: Chief Complaint  Patient presents with  . Right Shoulder - Pain, Follow-up    Patient comes back today for follow-up of her right frozen shoulder. Overall she is feeling better. She is doing gentle range of motion and flexibility with her trainer. She is nontender knee resistive exercises.    Review of Systems   Objective: Vital Signs: There were no vitals taken for this visit.  Physical Exam  Ortho Exam Right shoulder exam shows good forward flexion and abduction external rotation but still mild limitation with internal rotation with pain. Specialty Comments:  No specialty comments available.  Imaging: No results found.   PMFS History: Patient Active Problem List   Diagnosis Date Noted  . Chronic right shoulder pain 10/18/2016  . Angioedema 04/30/2015  . Allergic rhinitis 04/30/2015   Past Medical History:    Diagnosis Date  . Angioedema 04/30/2015  . Hypertension   . Migraine     Family History  Problem Relation Age of Onset  . Alcoholism Father   . Diabetes Maternal Grandmother     Past Surgical History:  Procedure Laterality Date  . ABDOMINAL HYSTERECTOMY    . BUNIONECTOMY    . TONSILLECTOMY     Social History   Occupational History  . Not on file.   Social History Main Topics  . Smoking status: Never Smoker  . Smokeless tobacco: Never Used  . Alcohol use No  . Drug use: No  . Sexual activity: Not on file

## 2016-10-18 NOTE — Addendum Note (Signed)
Addended by: Albertina Parr on: 10/18/2016 08:48 AM   Modules accepted: Orders

## 2016-11-18 ENCOUNTER — Ambulatory Visit (INDEPENDENT_AMBULATORY_CARE_PROVIDER_SITE_OTHER): Payer: BC Managed Care – PPO | Admitting: Physical Medicine and Rehabilitation

## 2016-11-18 DIAGNOSIS — G8929 Other chronic pain: Secondary | ICD-10-CM

## 2016-11-18 DIAGNOSIS — M25511 Pain in right shoulder: Secondary | ICD-10-CM

## 2016-11-18 NOTE — Progress Notes (Deleted)
Leah Khan - 51 y.o. female MRN [id number]  Date of birth: October 16, 1965  Office Visit Note: Visit Date: 11/18/2016 PCP: Lupita Raider, MD Referred by: Lupita Raider, MD  Subjective: Chief Complaint  Patient presents with  . Right Shoulder - Pain   HPI: HPI  ROS Otherwise per HPI.  Assessment & Plan: Visit Diagnoses: No diagnosis found.  Plan: No additional findings.   Meds & Orders: No orders of the defined types were placed in this encounter.  No orders of the defined types were placed in this encounter.   Follow-up: No Follow-up on file.   Procedures: No procedures performed  No notes on file   Clinical History: No specialty comments available.  She reports that she has never smoked. She has never used smokeless tobacco. No results for input(s): HGBA1C, LABURIC in the last 8760 hours.  Objective:  VS:  HT:    WT:   BMI:     BP:   HR: bpm  TEMP: ( )  RESP:  Physical Exam  Ortho Exam Imaging: No results found.  Past Medical/Family/Surgical/Social History: Medications & Allergies reviewed per EMR Patient Active Problem List   Diagnosis Date Noted  . Chronic right shoulder pain 10/18/2016  . Angioedema 04/30/2015  . Allergic rhinitis 04/30/2015   Past Medical History:  Diagnosis Date  . Angioedema 04/30/2015  . Hypertension   . Migraine    Family History  Problem Relation Age of Onset  . Alcoholism Father   . Diabetes Maternal Grandmother    Past Surgical History:  Procedure Laterality Date  . ABDOMINAL HYSTERECTOMY    . BUNIONECTOMY    . TONSILLECTOMY     Social History   Occupational History  . Not on file.   Social History Main Topics  . Smoking status: Never Smoker  . Smokeless tobacco: Never Used  . Alcohol use No  . Drug use: No  . Sexual activity: Not on file

## 2016-11-18 NOTE — Progress Notes (Signed)
Patient states she had 100% relief with last injection she had a month ago. Pain free today. I'm not going to charge for the visit today. I spoke with her briefly and she's doing well. She'll follow up with Dr. Roda ShuttersXu shoulder pain returned.

## 2016-11-29 ENCOUNTER — Ambulatory Visit (INDEPENDENT_AMBULATORY_CARE_PROVIDER_SITE_OTHER): Payer: BC Managed Care – PPO | Admitting: Orthopaedic Surgery

## 2016-11-30 ENCOUNTER — Ambulatory Visit (INDEPENDENT_AMBULATORY_CARE_PROVIDER_SITE_OTHER): Payer: BC Managed Care – PPO | Admitting: Orthopaedic Surgery

## 2017-07-27 ENCOUNTER — Telehealth (INDEPENDENT_AMBULATORY_CARE_PROVIDER_SITE_OTHER): Payer: Self-pay | Admitting: Orthopaedic Surgery

## 2017-07-27 ENCOUNTER — Other Ambulatory Visit (INDEPENDENT_AMBULATORY_CARE_PROVIDER_SITE_OTHER): Payer: Self-pay

## 2017-07-27 DIAGNOSIS — G8929 Other chronic pain: Secondary | ICD-10-CM

## 2017-07-27 DIAGNOSIS — M25511 Pain in right shoulder: Principal | ICD-10-CM

## 2017-07-27 NOTE — Telephone Encounter (Signed)
Please advise 

## 2017-07-27 NOTE — Telephone Encounter (Signed)
Called Patient no answer, LMOM to advise order was made for shoulder inj. Someone will call her to schedule appt

## 2017-07-27 NOTE — Telephone Encounter (Signed)
Yeah that's fine.  She can schedule one with newton directly

## 2017-07-27 NOTE — Telephone Encounter (Signed)
Patient called requesting another injection in her right shoulder by Dr. Alvester MorinNewton. Will Dr. Roda ShuttersXu need to see her again before she is referred for another inj? Please call back today if possible # [phone].

## 2017-08-10 ENCOUNTER — Ambulatory Visit (INDEPENDENT_AMBULATORY_CARE_PROVIDER_SITE_OTHER): Payer: BC Managed Care – PPO | Admitting: Physical Medicine and Rehabilitation

## 2017-08-10 ENCOUNTER — Ambulatory Visit (INDEPENDENT_AMBULATORY_CARE_PROVIDER_SITE_OTHER): Payer: BC Managed Care – PPO

## 2017-08-10 DIAGNOSIS — M25511 Pain in right shoulder: Secondary | ICD-10-CM

## 2017-08-10 DIAGNOSIS — G8929 Other chronic pain: Secondary | ICD-10-CM

## 2017-08-10 NOTE — Patient Instructions (Signed)

## 2017-08-10 NOTE — Progress Notes (Deleted)
Pt states pain in right shoulder. Pt states pain has been at its worse for about 1 month. Pt states being relaxed and sitting makes it worse, pt states nothing makes it better. Pt states last injection on 11/18/16 helped and lasted until a month ago. -Dye Allergies.

## 2017-08-10 NOTE — Progress Notes (Signed)
Leah Khan - 52 y.o. female MRN [id number]  Date of birth: 1966/01/25  Office Visit Note: Visit Date: 08/10/2017 PCP: Leah Raider, MD Referred by: Leah Raider, MD  Subjective: Chief Complaint  Patient presents with  . Right Shoulder - Pain   HPI: Leah Khan is a 52 year old right-hand-dominant female who comes in today for repeat intra-articular injection of the right glenohumeral joint.  We saw her back in late April at the request of Leah Khan for injection that did extremely well.  We were going to repeat the injection after therapy but she was almost 100% relief at that time in May.  She comes in today stating that for about 1 month now is been worsening and she is again getting to a point where she cannot really raise her arm has lacked some significant range of motion.  We will go ahead and repeat the injection today.  There is been no new trauma.  She will continue with her exercises and follow up with Leah Khan.    ROS Otherwise per HPI.  Assessment & Plan: Visit Diagnoses:  1. Chronic right shoulder pain     Plan: No additional findings.   Meds & Orders: No orders of the defined types were placed in this encounter.   Orders Placed This Encounter  Procedures  . Large Joint Inj: R glenohumeral  . XR C-ARM NO REPORT    Follow-up: Return if symptoms worsen or fail to improve, for Leah Khan prior to do another injection.   Procedures: Large Joint Inj: R glenohumeral on 08/10/2017 1:59 PM Indications: pain and diagnostic evaluation Details: 22 G 3.5 in needle, fluoroscopy-guided anteromedial approach  Arthrogram: No  Medications: 80 mg triamcinolone acetonide 40 MG/ML; 3 mL bupivacaine 0.5 % Outcome: tolerated well, no immediate complications  There was excellent flow of contrast producing a partial arthrogram of the glenohumeral joint. The patient did have relief of symptoms during the anesthetic phase of the injection. Procedure, treatment alternatives, risks  and benefits explained, specific risks discussed. Consent was given by the patient. Immediately prior to procedure a time out was called to verify the correct patient, procedure, equipment, support staff and site/side marked as required. Patient was prepped and draped in the usual sterile fashion.      No notes on file   Clinical History: No specialty comments available.  She reports that  has never smoked. she has never used smokeless tobacco. No results for input(s): HGBA1C, LABURIC in the last 8760 hours.  Objective:  VS:  HT:    WT:   BMI:     BP:   HR: bpm  TEMP: ( )  RESP:  Physical Exam  Musculoskeletal:  Right shoulder lacks range of motion with extension and abduction.  She does have painful range of motion.    Ortho Exam Imaging: Xr C-arm No Report  Result Date: 08/10/2017 Please see Notes or Procedures tab for imaging impression.   Past Medical/Family/Surgical/Social History: Medications & Allergies reviewed per EMR Patient Active Problem List   Diagnosis Date Noted  . Chronic right shoulder pain 10/18/2016  . Angioedema 04/30/2015  . Allergic rhinitis 04/30/2015   Past Medical History:  Diagnosis Date  . Angioedema 04/30/2015  . Hypertension   . Migraine    Family History  Problem Relation Age of Onset  . Alcoholism Father   . Diabetes Maternal Grandmother    Past Surgical History:  Procedure Laterality Date  . ABDOMINAL HYSTERECTOMY    . BUNIONECTOMY    .  TONSILLECTOMY     Social History   Occupational History  . Not on file  Tobacco Use  . Smoking status: Never Smoker  . Smokeless tobacco: Never Used  Substance and Sexual Activity  . Alcohol use: No    Alcohol/week: 0.0 oz  . Drug use: No  . Sexual activity: Not on file

## 2017-08-11 MED ORDER — TRIAMCINOLONE ACETONIDE 40 MG/ML IJ SUSP
80.0000 mg | INTRAMUSCULAR | Status: AC | PRN
  Administered 2017-08-10: 80 mg via INTRA_ARTICULAR

## 2017-08-11 MED ORDER — BUPIVACAINE HCL 0.5 % IJ SOLN
3.0000 mL | INTRAMUSCULAR | Status: AC | PRN
  Administered 2017-08-10: 3 mL via INTRA_ARTICULAR

## 2017-09-28 ENCOUNTER — Other Ambulatory Visit: Payer: Self-pay

## 2018-01-10 ENCOUNTER — Ambulatory Visit (INDEPENDENT_AMBULATORY_CARE_PROVIDER_SITE_OTHER): Payer: BC Managed Care – PPO | Admitting: Orthopaedic Surgery

## 2018-01-10 DIAGNOSIS — G8929 Other chronic pain: Secondary | ICD-10-CM | POA: Diagnosis not present

## 2018-01-10 DIAGNOSIS — M25511 Pain in right shoulder: Secondary | ICD-10-CM | POA: Diagnosis not present

## 2018-01-10 NOTE — Progress Notes (Signed)
Office Visit Note   Patient: Leah Khan           Date of Birth: [birth year]           MRN: [id number] Visit Date: 01/10/2018              Requested by: Lupita Raider, MD [address] [place], [place] [address] PCP: Lupita Raider, MD   Assessment & Plan: Visit Diagnoses:  1. Chronic right shoulder pain     Plan: Impression is chronic right shoulder pain.  Altogether now she has had pain for a year with temporary relief from cortisone injections.  At this point we will need to obtain an MR arthrogram to rule out structural abnormalities before any other interventions.  Follow-up after the MRI.  Follow-Up Instructions: Return in about 10 days (around 01/20/2018).   Orders:  Orders Placed This Encounter  Procedures  . MR SHOULDER RIGHT W CONTRAST  . Arthrogram   No orders of the defined types were placed in this encounter.     Procedures: No procedures performed   Clinical Data: No additional findings.   Subjective: Chief Complaint  Patient presents with  . Right Shoulder - Pain    Leah Khan follows up today for chronic right shoulder pain.  She is requesting another shoulder injection.  She states that the injection does give her significant relief but each injection has only been temporary.   Review of Systems  Constitutional: Negative.   HENT: Negative.   Eyes: Negative.   Respiratory: Negative.   Cardiovascular: Negative.   Endocrine: Negative.   Musculoskeletal: Negative.   Neurological: Negative.   Hematological: Negative.   Psychiatric/Behavioral: Negative.   All other systems reviewed and are negative.    Objective: Vital Signs: There were no vitals taken for this visit.  Physical Exam  Constitutional: She is oriented to person, place, and time. She appears well-developed and well-nourished.  Pulmonary/Chest: Effort normal.  Neurological: She is alert and oriented to person, place, and time.  Skin: Skin is warm. Capillary  refill takes less than 2 seconds.  Psychiatric: She has a normal mood and affect. Her behavior is normal. Judgment and thought content normal.  Nursing note and vitals reviewed.   Ortho Exam Right shoulder exam shows painful abduction and external rotation.  Normal forward flexion.  Mildly positive empty can. Specialty Comments:  No specialty comments available.  Imaging: No results found.   PMFS History: Patient Active Problem List   Diagnosis Date Noted  . Chronic right shoulder pain 10/18/2016  . Angioedema 04/30/2015  . Allergic rhinitis 04/30/2015   Past Medical History:  Diagnosis Date  . Angioedema 04/30/2015  . Hypertension   . Migraine     Family History  Problem Relation Age of Onset  . Alcoholism Father   . Diabetes Maternal Grandmother     Past Surgical History:  Procedure Laterality Date  . ABDOMINAL HYSTERECTOMY    . BUNIONECTOMY    . TONSILLECTOMY     Social History   Occupational History  . Not on file  Tobacco Use  . Smoking status: Never Smoker  . Smokeless tobacco: Never Used  Substance and Sexual Activity  . Alcohol use: No    Alcohol/week: 0.0 oz  . Drug use: No  . Sexual activity: Not on file

## 2018-01-27 ENCOUNTER — Ambulatory Visit
Admission: RE | Admit: 2018-01-27 | Discharge: 2018-01-27 | Disposition: A | Discharge status: Home / Self Care | Payer: BC Managed Care – PPO | Source: Ambulatory Visit | Attending: Orthopaedic Surgery | Admitting: Orthopaedic Surgery

## 2018-01-27 DIAGNOSIS — M25511 Pain in right shoulder: Principal | ICD-10-CM

## 2018-01-27 DIAGNOSIS — G8929 Other chronic pain: Secondary | ICD-10-CM

## 2018-01-27 MED ORDER — IOPAMIDOL (ISOVUE-M 200) INJECTION 41%
15.0000 mL | Freq: Once | INTRAMUSCULAR | Status: AC
  Administered 2018-01-27: 15 mL via INTRA_ARTICULAR

## 2018-01-31 ENCOUNTER — Ambulatory Visit (INDEPENDENT_AMBULATORY_CARE_PROVIDER_SITE_OTHER): Payer: BC Managed Care – PPO | Admitting: Orthopaedic Surgery

## 2018-01-31 ENCOUNTER — Encounter (INDEPENDENT_AMBULATORY_CARE_PROVIDER_SITE_OTHER): Payer: Self-pay | Admitting: Orthopaedic Surgery

## 2018-01-31 DIAGNOSIS — M75101 Unspecified rotator cuff tear or rupture of right shoulder, not specified as traumatic: Secondary | ICD-10-CM | POA: Diagnosis not present

## 2018-01-31 MED ORDER — METHYLPREDNISOLONE ACETATE 40 MG/ML IJ SUSP
40.0000 mg | INTRAMUSCULAR | Status: AC | PRN
  Administered 2018-01-31: 40 mg via INTRA_ARTICULAR

## 2018-01-31 MED ORDER — BUPIVACAINE HCL 0.5 % IJ SOLN
3.0000 mL | INTRAMUSCULAR | Status: AC | PRN
  Administered 2018-01-31: 3 mL via INTRA_ARTICULAR

## 2018-01-31 MED ORDER — LIDOCAINE HCL 1 % IJ SOLN
3.0000 mL | INTRAMUSCULAR | Status: AC | PRN
  Administered 2018-01-31: 3 mL

## 2018-01-31 NOTE — Progress Notes (Signed)
Office Visit Note   Patient: Leah Khan           Date of Birth: Jul 26, 1965           MRN: [id number] Visit Date: 01/31/2018              Requested by: Lupita Raider, MD [address] [place], [place] [address] PCP: Lupita Raider, MD   Assessment & Plan: Visit Diagnoses:  1. Tear of right supraspinatus tendon     Plan: MRI findings are consistent with a high-grade partial-thickness tear of the supraspinatus.  These findings were discussed with the patient.  Patient continues to be symptomatic and does get 6 months of relief from subacromial injections.  She is considering surgery for rotator cuff repair but for now she would like to get another injection today to see if she still gets the same amount of relief.  She is set to retire in about 7 months after which she may consider surgery.  Follow-Up Instructions: Return if symptoms worsen or fail to improve.   Orders:  Orders Placed This Encounter  Procedures  . Large Joint Inj: R subacromial bursa   No orders of the defined types were placed in this encounter.     Procedures: Large Joint Inj: R subacromial bursa on 01/31/2018 1:51 PM Indications: pain Details: 22 G needle  Arthrogram: No  Medications: 3 mL bupivacaine 0.5 %; 3 mL lidocaine 1 %; 40 mg methylPREDNISolone acetate 40 MG/ML Outcome: tolerated well, no immediate complications Consent was given by the patient. Patient was prepped and draped in the usual sterile fashion.       Clinical Data: No additional findings.   Subjective: Chief Complaint  Patient presents with  . Right Shoulder - Pain, Follow-up    Patient is here to review her MRI.   Review of Systems   Objective: Vital Signs: There were no vitals taken for this visit.  Physical Exam  Ortho Exam Right shoulder exam is stable. Specialty Comments:  No specialty comments available.  Imaging: No results found.   PMFS History: Patient Active Problem List   Diagnosis Date Noted  . Tear of right supraspinatus tendon 01/31/2018  . Chronic right shoulder pain 10/18/2016  . Angioedema 04/30/2015  . Allergic rhinitis 04/30/2015   Past Medical History:  Diagnosis Date  . Angioedema 04/30/2015  . Hypertension   . Migraine     Family History  Problem Relation Age of Onset  . Alcoholism Father   . Diabetes Maternal Grandmother     Past Surgical History:  Procedure Laterality Date  . ABDOMINAL HYSTERECTOMY    . BUNIONECTOMY    . TONSILLECTOMY     Social History   Occupational History  . Not on file  Tobacco Use  . Smoking status: Never Smoker  . Smokeless tobacco: Never Used  Substance and Sexual Activity  . Alcohol use: No    Alcohol/week: 0.0 oz  . Drug use: No  . Sexual activity: Not on file

## 2018-02-01 ENCOUNTER — Telehealth (INDEPENDENT_AMBULATORY_CARE_PROVIDER_SITE_OTHER): Payer: Self-pay

## 2018-02-01 NOTE — Telephone Encounter (Signed)
Patient wants to proceed with surgery.  Please provide order with particulars.  Thanks!

## 2018-02-01 NOTE — Telephone Encounter (Signed)
We will have to wait 6 weeks from yesterday since she got steroid injection. Right shoulder scope, extensive debridement, acromioplasty, possible RCR. Usual stuff [id number], [id number], [phone]

## 2018-02-14 NOTE — Telephone Encounter (Signed)
Spoke with patient and scheduled surgery for 03/15/18.  How long will she be out of work?  What is recovery time?  She does payroll.

## 2018-02-15 NOTE — Telephone Encounter (Signed)
2-12 weeks depending on what we do during surgery and if there is light duty available

## 2018-02-28 NOTE — Telephone Encounter (Signed)
I called and advised patient.

## 2018-03-08 ENCOUNTER — Other Ambulatory Visit: Payer: Self-pay

## 2018-03-08 ENCOUNTER — Encounter (HOSPITAL_BASED_OUTPATIENT_CLINIC_OR_DEPARTMENT_OTHER): Payer: Self-pay | Admitting: *Deleted

## 2018-03-14 ENCOUNTER — Encounter (HOSPITAL_BASED_OUTPATIENT_CLINIC_OR_DEPARTMENT_OTHER)
Admission: RE | Admit: 2018-03-14 | Discharge: 2018-03-14 | Disposition: A | Discharge status: Home / Self Care | Payer: BC Managed Care – PPO | Source: Ambulatory Visit | Attending: Orthopaedic Surgery | Admitting: Orthopaedic Surgery

## 2018-03-14 DIAGNOSIS — M7541 Impingement syndrome of right shoulder: Secondary | ICD-10-CM | POA: Diagnosis not present

## 2018-03-14 DIAGNOSIS — M75121 Complete rotator cuff tear or rupture of right shoulder, not specified as traumatic: Secondary | ICD-10-CM | POA: Diagnosis not present

## 2018-03-14 DIAGNOSIS — M65811 Other synovitis and tenosynovitis, right shoulder: Secondary | ICD-10-CM | POA: Diagnosis not present

## 2018-03-14 DIAGNOSIS — I10 Essential (primary) hypertension: Secondary | ICD-10-CM | POA: Diagnosis not present

## 2018-03-14 DIAGNOSIS — Z01818 Encounter for other preprocedural examination: Secondary | ICD-10-CM | POA: Insufficient documentation

## 2018-03-15 ENCOUNTER — Encounter (INDEPENDENT_AMBULATORY_CARE_PROVIDER_SITE_OTHER): Payer: Self-pay

## 2018-03-15 ENCOUNTER — Encounter (HOSPITAL_BASED_OUTPATIENT_CLINIC_OR_DEPARTMENT_OTHER)
Admission: RE | Disposition: A | Discharge status: Home / Self Care | Payer: Self-pay | Source: Ambulatory Visit | Attending: Orthopaedic Surgery

## 2018-03-15 ENCOUNTER — Encounter (HOSPITAL_BASED_OUTPATIENT_CLINIC_OR_DEPARTMENT_OTHER): Payer: Self-pay | Admitting: *Deleted

## 2018-03-15 ENCOUNTER — Ambulatory Visit (HOSPITAL_BASED_OUTPATIENT_CLINIC_OR_DEPARTMENT_OTHER): Payer: BC Managed Care – PPO | Admitting: Anesthesiology

## 2018-03-15 ENCOUNTER — Encounter (INDEPENDENT_AMBULATORY_CARE_PROVIDER_SITE_OTHER): Payer: Self-pay | Admitting: Orthopaedic Surgery

## 2018-03-15 ENCOUNTER — Ambulatory Visit (HOSPITAL_BASED_OUTPATIENT_CLINIC_OR_DEPARTMENT_OTHER)
Admission: RE | Admit: 2018-03-15 | Discharge: 2018-03-15 | Disposition: A | Discharge status: Home / Self Care | Payer: BC Managed Care – PPO | Source: Ambulatory Visit | Attending: Orthopaedic Surgery | Admitting: Orthopaedic Surgery

## 2018-03-15 ENCOUNTER — Other Ambulatory Visit: Payer: Self-pay

## 2018-03-15 DIAGNOSIS — I10 Essential (primary) hypertension: Secondary | ICD-10-CM | POA: Diagnosis not present

## 2018-03-15 DIAGNOSIS — M65811 Other synovitis and tenosynovitis, right shoulder: Secondary | ICD-10-CM | POA: Diagnosis not present

## 2018-03-15 DIAGNOSIS — Z79899 Other long term (current) drug therapy: Secondary | ICD-10-CM | POA: Diagnosis not present

## 2018-03-15 DIAGNOSIS — M7541 Impingement syndrome of right shoulder: Secondary | ICD-10-CM

## 2018-03-15 DIAGNOSIS — M75121 Complete rotator cuff tear or rupture of right shoulder, not specified as traumatic: Secondary | ICD-10-CM | POA: Insufficient documentation

## 2018-03-15 DIAGNOSIS — M75101 Unspecified rotator cuff tear or rupture of right shoulder, not specified as traumatic: Secondary | ICD-10-CM | POA: Diagnosis not present

## 2018-03-15 HISTORY — PX: SHOULDER ARTHROSCOPY WITH ROTATOR CUFF REPAIR AND SUBACROMIAL DECOMPRESSION: SHX5686

## 2018-03-15 HISTORY — DX: Unspecified disorder of synovium and tendon, right shoulder: M67.911

## 2018-03-15 SURGERY — SHOULDER ARTHROSCOPY WITH ROTATOR CUFF REPAIR AND SUBACROMIAL DECOMPRESSION
Anesthesia: General | Site: Shoulder | Laterality: Right

## 2018-03-15 MED ORDER — PROMETHAZINE HCL 25 MG/ML IJ SOLN: 6.2500 mg | INTRAMUSCULAR | Status: DC | PRN

## 2018-03-15 MED ORDER — SCOPOLAMINE 1 MG/3DAYS TD PT72: 1.0000 | MEDICATED_PATCH | Freq: Once | TRANSDERMAL | Status: DC | PRN

## 2018-03-15 MED ORDER — OXYCODONE-ACETAMINOPHEN 5-325 MG PO TABS: 1.0000 | ORAL_TABLET | ORAL | 0 refills | Status: DC | PRN

## 2018-03-15 MED ORDER — FENTANYL CITRATE (PF) 100 MCG/2ML IJ SOLN: 25.0000 ug | INTRAMUSCULAR | Status: DC | PRN

## 2018-03-15 MED ORDER — BUPIVACAINE-EPINEPHRINE (PF) 0.25% -1:200000 IJ SOLN
INTRAMUSCULAR | Status: AC
  Filled 2018-03-15: qty 30

## 2018-03-15 MED ORDER — MEPERIDINE HCL 25 MG/ML IJ SOLN: 6.2500 mg | INTRAMUSCULAR | Status: DC | PRN

## 2018-03-15 MED ORDER — CHLORHEXIDINE GLUCONATE 4 % EX LIQD: 60.0000 mL | Freq: Once | CUTANEOUS | Status: DC

## 2018-03-15 MED ORDER — ONDANSETRON HCL 4 MG PO TABS: 4.0000 mg | ORAL_TABLET | Freq: Three times a day (TID) | ORAL | 0 refills | Status: DC | PRN

## 2018-03-15 MED ORDER — LIDOCAINE 2% (20 MG/ML) 5 ML SYRINGE
INTRAMUSCULAR | Status: DC | PRN
  Administered 2018-03-15: 40 mg via INTRAVENOUS

## 2018-03-15 MED ORDER — PROPOFOL 500 MG/50ML IV EMUL
INTRAVENOUS | Status: AC
  Filled 2018-03-15: qty 50

## 2018-03-15 MED ORDER — PROMETHAZINE HCL 25 MG PO TABS: 25.0000 mg | ORAL_TABLET | Freq: Four times a day (QID) | ORAL | 1 refills | Status: DC | PRN

## 2018-03-15 MED ORDER — ONDANSETRON HCL 4 MG/2ML IJ SOLN
INTRAMUSCULAR | Status: AC
  Filled 2018-03-15: qty 2

## 2018-03-15 MED ORDER — DEXAMETHASONE SODIUM PHOSPHATE 4 MG/ML IJ SOLN
INTRAMUSCULAR | Status: DC | PRN
  Administered 2018-03-15: 10 mg via INTRAVENOUS

## 2018-03-15 MED ORDER — LACTATED RINGERS IV SOLN: INTRAVENOUS | Status: DC

## 2018-03-15 MED ORDER — PROPOFOL 10 MG/ML IV BOLUS
INTRAVENOUS | Status: DC | PRN
  Administered 2018-03-15: 120 mg via INTRAVENOUS

## 2018-03-15 MED ORDER — LACTATED RINGERS IV SOLN
INTRAVENOUS | Status: DC
  Administered 2018-03-15 (×2): via INTRAVENOUS

## 2018-03-15 MED ORDER — CEFAZOLIN SODIUM-DEXTROSE 2-4 GM/100ML-% IV SOLN
2.0000 g | INTRAVENOUS | Status: AC
  Administered 2018-03-15: 2 g via INTRAVENOUS

## 2018-03-15 MED ORDER — SUGAMMADEX SODIUM 200 MG/2ML IV SOLN
INTRAVENOUS | Status: DC | PRN
  Administered 2018-03-15: 200 mg via INTRAVENOUS

## 2018-03-15 MED ORDER — SENNOSIDES-DOCUSATE SODIUM 8.6-50 MG PO TABS: 1.0000 | ORAL_TABLET | Freq: Every evening | ORAL | 1 refills | Status: DC | PRN

## 2018-03-15 MED ORDER — CEFAZOLIN SODIUM-DEXTROSE 2-4 GM/100ML-% IV SOLN
INTRAVENOUS | Status: AC
  Filled 2018-03-15: qty 100

## 2018-03-15 MED ORDER — EPHEDRINE 5 MG/ML INJ
INTRAVENOUS | Status: AC
  Filled 2018-03-15: qty 10

## 2018-03-15 MED ORDER — OXYCODONE HCL 5 MG PO TABS: 5.0000 mg | ORAL_TABLET | Freq: Once | ORAL | Status: DC | PRN

## 2018-03-15 MED ORDER — MIDAZOLAM HCL 2 MG/2ML IJ SOLN
1.0000 mg | INTRAMUSCULAR | Status: DC | PRN
  Administered 2018-03-15: 2 mg via INTRAVENOUS

## 2018-03-15 MED ORDER — EPHEDRINE SULFATE-NACL 50-0.9 MG/10ML-% IV SOSY
PREFILLED_SYRINGE | INTRAVENOUS | Status: DC | PRN
  Administered 2018-03-15 (×3): 10 mg via INTRAVENOUS

## 2018-03-15 MED ORDER — ROCURONIUM BROMIDE 10 MG/ML (PF) SYRINGE
PREFILLED_SYRINGE | INTRAVENOUS | Status: DC | PRN
  Administered 2018-03-15: 30 mg via INTRAVENOUS

## 2018-03-15 MED ORDER — DEXAMETHASONE SODIUM PHOSPHATE 10 MG/ML IJ SOLN
INTRAMUSCULAR | Status: AC
  Filled 2018-03-15: qty 1

## 2018-03-15 MED ORDER — ROPIVACAINE HCL 7.5 MG/ML IJ SOLN
INTRAMUSCULAR | Status: DC | PRN
  Administered 2018-03-15: 20 mL via PERINEURAL

## 2018-03-15 MED ORDER — FENTANYL CITRATE (PF) 100 MCG/2ML IJ SOLN
50.0000 ug | INTRAMUSCULAR | Status: DC | PRN
  Administered 2018-03-15: 100 ug via INTRAVENOUS

## 2018-03-15 MED ORDER — FENTANYL CITRATE (PF) 100 MCG/2ML IJ SOLN
INTRAMUSCULAR | Status: AC
  Filled 2018-03-15: qty 2

## 2018-03-15 MED ORDER — OXYCODONE HCL 5 MG/5ML PO SOLN: 5.0000 mg | Freq: Once | ORAL | Status: DC | PRN

## 2018-03-15 MED ORDER — ONDANSETRON HCL 4 MG/2ML IJ SOLN
INTRAMUSCULAR | Status: DC | PRN
  Administered 2018-03-15: 4 mg via INTRAVENOUS

## 2018-03-15 MED ORDER — MIDAZOLAM HCL 2 MG/2ML IJ SOLN
INTRAMUSCULAR | Status: AC
  Filled 2018-03-15: qty 2

## 2018-03-15 SURGICAL SUPPLY — 67 items
ANCHOR BIOCOMP SWIVELOCK (Anchor) ×3 IMPLANT
BENZOIN TINCTURE PRP APPL 2/3 (GAUZE/BANDAGES/DRESSINGS) IMPLANT
BLADE 4.2CUDA (BLADE) IMPLANT
BLADE CUTTER GATOR 3.5 (BLADE) IMPLANT
BLADE GREAT WHITE 4.2 (BLADE) IMPLANT
BLADE GREAT WHITE 4.2MM (BLADE)
BLADE SURG 15 STRL LF DISP TIS (BLADE) IMPLANT
BLADE SURG 15 STRL SS (BLADE)
BUR OVAL 4.0 (BURR) ×3 IMPLANT
CANNULA 5.75X71 LONG (CANNULA) ×3 IMPLANT
CANNULA TWIST IN 8.25X7CM (CANNULA) ×3 IMPLANT
CANNULA TWIST IN 8.25X9CM (CANNULA) IMPLANT
CLOSURE STERI-STRIP 1/2X4 (GAUZE/BANDAGES/DRESSINGS) ×1
CLSR STERI-STRIP ANTIMIC 1/2X4 (GAUZE/BANDAGES/DRESSINGS) ×2 IMPLANT
DECANTER SPIKE VIAL GLASS SM (MISCELLANEOUS) IMPLANT
DRAPE IMP U-DRAPE 54X76 (DRAPES) ×3 IMPLANT
DRAPE INCISE IOBAN 66X45 STRL (DRAPES) ×3 IMPLANT
DRAPE STERI 35X30 U-POUCH (DRAPES) ×3 IMPLANT
DRAPE SURG 17X23 STRL (DRAPES) ×3 IMPLANT
DRAPE U-SHAPE 47X51 STRL (DRAPES) ×3 IMPLANT
DRAPE U-SHAPE 76X120 STRL (DRAPES) ×6 IMPLANT
DRSG PAD ABDOMINAL 8X10 ST (GAUZE/BANDAGES/DRESSINGS) ×6 IMPLANT
DURAPREP 26ML APPLICATOR (WOUND CARE) ×3 IMPLANT
ELECT REM PT RETURN 9FT ADLT (ELECTROSURGICAL)
ELECTRODE REM PT RTRN 9FT ADLT (ELECTROSURGICAL) IMPLANT
GAUZE SPONGE 4X4 12PLY STRL (GAUZE/BANDAGES/DRESSINGS) ×6 IMPLANT
GAUZE XEROFORM 1X8 LF (GAUZE/BANDAGES/DRESSINGS) ×3 IMPLANT
GLOVE BIO SURGEON STRL SZ 6.5 (GLOVE) ×2 IMPLANT
GLOVE BIO SURGEONS STRL SZ 6.5 (GLOVE) ×1
GLOVE BIOGEL PI IND STRL 7.0 (GLOVE) ×1 IMPLANT
GLOVE BIOGEL PI INDICATOR 7.0 (GLOVE) ×2
GLOVE ECLIPSE 7.0 STRL STRAW (GLOVE) IMPLANT
GLOVE SKINSENSE NS SZ7.5 (GLOVE) ×2
GLOVE SKINSENSE STRL SZ7.5 (GLOVE) ×1 IMPLANT
GLOVE SURG SYN 7.5  E (GLOVE) ×2
GLOVE SURG SYN 7.5 E (GLOVE) ×1 IMPLANT
GOWN STRL REIN XL XLG (GOWN DISPOSABLE) IMPLANT
GOWN STRL REUS W/ TWL LRG LVL3 (GOWN DISPOSABLE) ×1 IMPLANT
GOWN STRL REUS W/ TWL XL LVL3 (GOWN DISPOSABLE) ×1 IMPLANT
GOWN STRL REUS W/TWL LRG LVL3 (GOWN DISPOSABLE) ×2
GOWN STRL REUS W/TWL XL LVL3 (GOWN DISPOSABLE) ×2
IMMOBILIZER SHOULDER FOAM XLGE (SOFTGOODS) IMPLANT
KIT SHOULDER TRACTION (DRAPES) ×3 IMPLANT
MANIFOLD NEPTUNE II (INSTRUMENTS) ×3 IMPLANT
NEEDLE SCORPION MULTI FIRE (NEEDLE) ×3 IMPLANT
PACK ARTHROSCOPY DSU (CUSTOM PROCEDURE TRAY) ×3 IMPLANT
PACK BASIN DAY SURGERY FS (CUSTOM PROCEDURE TRAY) ×3 IMPLANT
PROBE BIPOLAR ATHRO 135MM 90D (MISCELLANEOUS) IMPLANT
SHEET MEDIUM DRAPE 40X70 STRL (DRAPES) ×3 IMPLANT
SLEEVE SCD COMPRESS KNEE MED (MISCELLANEOUS) ×3 IMPLANT
SLING ARM FOAM STRAP LRG (SOFTGOODS) IMPLANT
SLING ARM IMMOBILIZER LRG (SOFTGOODS) IMPLANT
SLING ARM IMMOBILIZER MED (SOFTGOODS) IMPLANT
SLING ARM MED ADULT FOAM STRAP (SOFTGOODS) ×3 IMPLANT
SLING ARM XL FOAM STRAP (SOFTGOODS) IMPLANT
SUT ETHILON 3 0 PS 1 (SUTURE) ×3 IMPLANT
SUT FIBERWIRE #2 38 T-5 BLUE (SUTURE)
SUT TIGER TAPE 7 IN WHITE (SUTURE) ×3 IMPLANT
SUTURE FIBERWR #2 38 T-5 BLUE (SUTURE) IMPLANT
SYR 50ML LL SCALE MARK (SYRINGE) IMPLANT
TAPE FIBER 2MM 7IN #2 BLUE (SUTURE) IMPLANT
TOWEL GREEN STERILE FF (TOWEL DISPOSABLE) ×3 IMPLANT
TOWEL OR NON WOVEN STRL DISP B (DISPOSABLE) IMPLANT
TUBE CONNECTING 20'X1/4 (TUBING)
TUBE CONNECTING 20X1/4 (TUBING) IMPLANT
TUBING ARTHRO INFLOW-ONLY STRL (TUBING) IMPLANT
WATER STERILE IRR 1000ML POUR (IV SOLUTION) ×3 IMPLANT

## 2018-03-15 NOTE — Progress Notes (Signed)
Assisted Dr. Germeroth with right, ultrasound guided, interscalene  block. Side rails up, monitors on throughout procedure. See vital signs in flow sheet. Tolerated Procedure well.  

## 2018-03-15 NOTE — Discharge Instructions (Signed)
Post Anesthesia Home Care Instructions  Activity: Get plenty of rest for the remainder of the day. A responsible individual must stay with you for 24 hours following the procedure.  For the next 24 hours, DO NOT: -Drive a car -Advertising copywriter -Drink alcoholic beverages -Take any medication unless instructed by your physician -Make any legal decisions or sign important papers.  Meals: Start with liquid foods such as gelatin or soup. Progress to regular foods as tolerated. Avoid greasy, spicy, heavy foods. If nausea and/or vomiting occur, drink only clear liquids until the nausea and/or vomiting subsides. Call your physician if vomiting continues.  Special Instructions/Symptoms: Your throat may feel dry or sore from the anesthesia or the breathing tube placed in your throat during surgery. If this causes discomfort, gargle with warm salt water. The discomfort should disappear within 24 hours.  If you had a scopolamine patch placed behind your ear for the management of post- operative nausea and/or vomiting:  1. The medication in the patch is effective for 72 hours, after which it should be removed.  Wrap patch in a tissue and discard in the trash. Wash hands thoroughly with soap and water. 2. You may remove the patch earlier than 72 hours if you experience unpleasant side effects which may include dry mouth, dizziness or visual disturbances. 3. Avoid touching the patch. Wash your hands with soap and water after contact with the patch.      Post-operative patient instructions  Shoulder Arthroscopy    Ice:  Place intermittent ice or cooler pack over your shoulder, 30 minutes on and 30 minutes off.  Continue this for the first 72 hours after surgery, then save ice for use after therapy sessions or on more active days.    Weight:  You may NOT bear weight on your arm as your symptoms allow.  Motion:  Do not perform range of motion of shoulder  Dressing:  Perform 1st dressing change  at 2 days postoperative. A moderate amount of blood tinged drainage is to be expected.  So if you bleed through the dressing on the first or second day or if you have fevers, it is fine to change the dressing/check the wounds early and redress wound.  If it bleeds through again, or if the incisions are leaking frank blood, please call the office. May change dressing every 1-2 days thereafter to help watch wounds. Can purchase Tegaderm (or 88M Nexcare) water resistant dressings at local pharmacy / [facility].  Shower:  Light shower is ok after 2 days.  Please take shower, NO bath. Recover with gauze and ace wrap to help keep wounds protected.    Pain medication:  A narcotic pain medication has been prescribed.  Take as directed.  Typically you need narcotic pain medication more regularly during the first 3 to 5 days after surgery.  Decrease your use of the medication as the pain improves.  Narcotics can sometimes cause constipation, even after a few doses.  If you have problems with constipation, you can take an over the counter stool softener or light laxative.  If you have persistent problems, please notify your physicians office.  Physical therapy: Additional activity guidelines to be provided by your physician or physical therapist at follow-up visits.   Driving: Do not recommend driving x 2 weeks post surgical, especially if surgery performed on right side. Should not drive while taking narcotic pain medications. It typically takes at least 2 weeks to restore sufficient neuromuscular function for normal reaction times for driving  safety.   Call [phone] for questions or problems. Evenings you will be forwarded to the hospital operator.  Ask for the orthopaedic physician on call. Please call if you experience:    o Redness, foul smelling, or persistent drainage from the surgical site  o worsening shoulder pain and swelling not responsive to medication  o any calf pain and or swelling of the lower  leg  o temperatures greater than 101.5 F o other questions or concerns   Thank you for allowing [place] to be a part of your care.      Regional Anesthesia Blocks  1. Numbness or the inability to move the "blocked" extremity may last from 3-48 hours after placement. The length of time depends on the medication injected and your individual response to the medication. If the numbness is not going away after 48 hours, call your surgeon.  2. The extremity that is blocked will need to be protected until the numbness is gone and the  Strength has returned. Because you cannot feel it, you will need to take extra care to avoid injury. Because it may be weak, you may have difficulty moving it or using it. You may not know what position it is in without looking at it while the block is in effect.  3. For blocks in the legs and feet, returning to weight bearing and walking needs to be done carefully. You will need to wait until the numbness is entirely gone and the strength has returned. You should be able to move your leg and foot normally before you try and bear weight or walk. You will need someone to be with you when you first try to ensure you do not fall and possibly risk injury.  4. Bruising and tenderness at the needle site are common side effects and will resolve in a few days.  5. Persistent numbness or new problems with movement should be communicated to the surgeon or the [facility] [phone] [facility] [phone]).      Post Anesthesia Home Care Instructions  Activity: Get plenty of rest for the remainder of the day. A responsible individual must stay with you for 24 hours following the procedure.  For the next 24 hours, DO NOT: -Drive a car -Advertising copywriter -Drink alcoholic beverages -Take any medication unless instructed by your physician -Make any legal decisions or sign important papers.  Meals: Start with liquid foods such as gelatin or  soup. Progress to regular foods as tolerated. Avoid greasy, spicy, heavy foods. If nausea and/or vomiting occur, drink only clear liquids until the nausea and/or vomiting subsides. Call your physician if vomiting continues.  Special Instructions/Symptoms: Your throat may feel dry or sore from the anesthesia or the breathing tube placed in your throat during surgery. If this causes discomfort, gargle with warm salt water. The discomfort should disappear within 24 hours.  If you had a scopolamine patch placed behind your ear for the management of post- operative nausea and/or vomiting:  1. The medication in the patch is effective for 72 hours, after which it should be removed.  Wrap patch in a tissue and discard in the trash. Wash hands thoroughly with soap and water. 2. You may remove the patch earlier than 72 hours if you experience unpleasant side effects which may include dry mouth, dizziness or visual disturbances. 3. Avoid touching the patch. Wash your hands with soap and water after contact with the patch.

## 2018-03-15 NOTE — Anesthesia Procedure Notes (Signed)
Anesthesia Regional Block: Interscalene brachial plexus block   Pre-Anesthetic Checklist: ,, timeout performed, Correct Patient, Correct Site, Correct Laterality, Correct Procedure, Correct Position, site marked, Risks and benefits discussed,  Surgical consent,  Pre-op evaluation,  At surgeon's request and post-op pain management  Laterality: Right  Prep: chloraprep       Needles:  Injection technique: Single-shot  Needle Type: Stimulator Needle - 40     Needle Length: 4cm  Needle Gauge: 22     Additional Needles:   Procedures:,,,, ultrasound used (permanent image in chart),,,,  Narrative:  Start time: 03/15/2018 8:25 AM End time: 03/15/2018 8:30 AM Injection made incrementally with aspirations every 5 mL. Anesthesiologist: Lewie Loron, MD  Additional Notes: BP cuff, EKG monitors applied. Sedation begun. Nerve location verified with U/S. Anesthetic injected incrementally, slowly , and after neg aspirations under direct u/s guidance. Good perineural spread. Tolerated well.

## 2018-03-15 NOTE — Op Note (Signed)
   Date of Surgery: 03/15/2018  INDICATIONS: The patient is a 52 year old female with right shoulder pain that has failed conservative treatment;  The patient did consent to the procedure after discussion of the risks and benefits.  PREOPERATIVE DIAGNOSIS:  1.  Full-thickness right supraspinatus tear without retraction 2.  Right shoulder impingement syndrome 3.  Right shoulder rotator cuff tendinosis 4.  Right shoulder synovitis  POSTOPERATIVE DIAGNOSIS: Same.  PROCEDURE:  1.  Arthroscopic right shoulder rotator cuff repair 2.  Arthroscopic right shoulder extensive debridement of rotator cuff, labrum, biceps, synovitis 3.  Arthroscopic right shoulder subacromial decompression with acromioplasty  SURGEON: N. Glee Arvin, M.D.  ASSIST: none  ANESTHESIA:  general, regional  IV FLUIDS AND URINE: See anesthesia.  ESTIMATED BLOOD LOSS: minimal mL.  IMPLANTS: Arthrex 4.75 mm self punching bio composite anchor  COMPLICATIONS: None.  DESCRIPTION OF PROCEDURE: The patient was brought to the operating room and placed supine on the operating table.  The patient had been signed prior to the procedure and this was documented. The patient had the anesthesia placed by the anesthesiologist.  A time-out was performed to confirm that this was the correct patient, site, side and location. The patient did receive antibiotics prior to the incision and was re-dosed during the procedure as needed at indicated intervals.  The patient was then moved into the lateral position with the operative extremity suspended in the fishing pole mechanism. The patient had the operative extremity prepped and draped in the standard surgical fashion.    Incisions were made for standard shoulder arthroscopy portals.  We first performed a diagnostic shoulder arthroscopy.  We encountered a [place] complex.  There was synovitis which was debrided.  There is some mild degenerative tearing of the labrum which was also gently debrided.   The articular surface of the supraspinatus exhibited significant tendinosis with a small area of full-thickness tearing without any retraction.  This was debrided using oscillating shaver.  We then repositioned the arthroscope into the subacromial space.  Subacromial decompression was performed with an acromioplasty using a high-speed bur.  Subdeltoid and subacromial bursa were excised using an oscillating shaver.  The bursal surface of the rotator cuff specifically the supraspinatus was gently debrided.  The tissue was severely friable.  After thorough debridement we encountered a full-thickness crescent-shaped non-retracted tear.  The greater tuberosity was then prepared down to bleeding bone using a high-speed bur.  We then performed a single row repair of the supraspinatus tendon with a single 4.75 mm self punching bio composite anchor.  Excess fluid was then removed from the shoulder joint.  Incisions were closed with interrupted nylon sutures.  Sterile dressings were applied.  Shoulder placed in a shoulder immobilizer.  Patient tolerated procedure well had no immediate complications.  POSTOPERATIVE PLAN: Patient will be discharged home and follow-up in 2 weeks.  Mayra Reel, MD [facility] [phone] 10:16 AM

## 2018-03-15 NOTE — H&P (Signed)
PREOPERATIVE H&P  Chief Complaint: right shoulder rotator cuff tear  HPI: Leah Khan is a 52 y.o. female who presents for surgical treatment of right shoulder rotator cuff tear.  She denies any changes in medical history.  Past Medical History:  Diagnosis Date  . Angioedema 04/30/2015  . Hypertension   . Migraine   . Rotator cuff disorder, right    Past Surgical History:  Procedure Laterality Date  . ABDOMINAL HYSTERECTOMY    . BUNIONECTOMY    . TONSILLECTOMY     Social History   Socioeconomic History  . Marital status: Married    Spouse name: Not on file  . Number of children: Not on file  . Years of education: Not on file  . Highest education level: Not on file  Occupational History  . Not on file  Social Needs  . Financial resource strain: Not on file  . Food insecurity:    Worry: Not on file    Inability: Not on file  . Transportation needs:    Medical: Not on file    Non-medical: Not on file  Tobacco Use  . Smoking status: Never Smoker  . Smokeless tobacco: Never Used  Substance and Sexual Activity  . Alcohol use: No    Alcohol/week: 0.0 standard drinks  . Drug use: No  . Sexual activity: Not on file  Lifestyle  . Physical activity:    Days per week: Not on file    Minutes per session: Not on file  . Stress: Not on file  Relationships  . Social connections:    Talks on phone: Not on file    Gets together: Not on file    Attends religious service: Not on file    Active member of club or organization: Not on file    Attends meetings of clubs or organizations: Not on file    Relationship status: Not on file  Other Topics Concern  . Not on file  Social History Narrative  . Not on file   Family History  Problem Relation Age of Onset  . Alcoholism Father   . Diabetes Maternal Grandmother    Allergies  Allergen Reactions  . Ace Inhibitors Anaphylaxis    angioedema  . Amitriptyline     Irritability   . Sertraline Hcl     Zombie  feeling   . Tape    Prior to Admission medications   Medication Sig Start Date End Date Taking? Authorizing Provider  amLODipine (NORVASC) 5 MG tablet Take 5 mg by mouth daily. 04/24/15  Yes [provider]  Galcanezumab-gnlm (EMGALITY, 300 MG DOSE, [place]) Inject into the skin every 30 (thirty) days.   Yes [provider]  Multiple Vitamin (MULTIVITAMIN) tablet Take 1 tablet by mouth daily.   Yes [provider]  topiramate (TOPAMAX) 100 MG tablet Take 100 mg by mouth daily. 02/01/15  Yes [provider]     Positive ROS: All other systems have been reviewed and were otherwise negative with the exception of those mentioned in the HPI and as above.  Physical Exam: General: Alert, no acute distress Cardiovascular: No pedal edema Respiratory: No cyanosis, no use of accessory musculature GI: abdomen soft Skin: No lesions in the area of chief complaint Neurologic: Sensation intact distally Psychiatric: Patient is competent for consent with normal mood and affect Lymphatic: no lymphedema  MUSCULOSKELETAL: exam stable  Assessment: right shoulder rotator cuff tear  Plan: Plan for Procedure(s): RIGHT SHOULDER ARTHROSCOPY WITH EXTENSIVE DEBRIDEMENT,  ACROMIOPLASTY, POSSIBLE ROTATOR CUFF REPAIR  The risks benefits and alternatives were discussed with the patient including but not limited to the risks of nonoperative treatment, versus surgical intervention including infection, bleeding, nerve injury,  blood clots, cardiopulmonary complications, morbidity, mortality, among others, and they were willing to proceed.   Glee Arvin, MD   03/15/2018 8:21 AM

## 2018-03-15 NOTE — Anesthesia Preprocedure Evaluation (Signed)
Anesthesia Evaluation  Patient identified by MRN, date of birth, ID band Patient awake    Reviewed: Allergy & Precautions, NPO status , Patient's Chart, lab work & pertinent test results  Airway Mallampati: I  TM Distance: >3 FB Neck ROM: Full    Dental  (+) Teeth Intact, Dental Advisory Given   Pulmonary neg pulmonary ROS,    Pulmonary exam normal breath sounds clear to auscultation       Cardiovascular hypertension, Pt. on medications Normal cardiovascular exam Rhythm:Regular Rate:Normal     Neuro/Psych negative neurological ROS  negative psych ROS   GI/Hepatic negative GI ROS, Neg liver ROS,   Endo/Other  negative endocrine ROS  Renal/GU negative Renal ROS     Musculoskeletal negative musculoskeletal ROS (+)   Abdominal   Peds  Hematology negative hematology ROS (+)   Anesthesia Other Findings   Reproductive/Obstetrics negative OB ROS                             Anesthesia Physical Anesthesia Plan  ASA: II  Anesthesia Plan: General   Post-op Pain Management: GA combined w/ Regional for post-op pain   Induction: Intravenous  PONV Risk Score and Plan: 3 and Ondansetron, Dexamethasone and Midazolam  Airway Management Planned: Oral ETT  Additional Equipment:   Intra-op Plan:   Post-operative Plan: Extubation in OR  Informed Consent: I have reviewed the patients History and Physical, chart, labs and discussed the procedure including the risks, benefits and alternatives for the proposed anesthesia with the patient or authorized representative who has indicated his/her understanding and acceptance.   Dental advisory given  Plan Discussed with: CRNA  Anesthesia Plan Comments:         Anesthesia Quick Evaluation

## 2018-03-15 NOTE — Anesthesia Procedure Notes (Signed)
Procedure Name: Intubation Date/Time: 03/15/2018 8:41 AM Performed by: Caren Macadam, CRNA Pre-anesthesia Checklist: Patient identified, Emergency Drugs available, Suction available and Patient being monitored Patient Re-evaluated:Patient Re-evaluated prior to induction Oxygen Delivery Method: Circle system utilized Preoxygenation: Pre-oxygenation with 100% oxygen Induction Type: IV induction Ventilation: Mask ventilation without difficulty Laryngoscope Size: Miller and 2 Grade View: Grade I Tube type: Oral Tube size: 7.0 mm Number of attempts: 1 Airway Equipment and Method: Stylet and Oral airway Placement Confirmation: ETT inserted through vocal cords under direct vision,  positive ETCO2 and breath sounds checked- equal and bilateral Secured at: 19 cm Tube secured with: Tape Dental Injury: Teeth and Oropharynx as per pre-operative assessment

## 2018-03-15 NOTE — Anesthesia Postprocedure Evaluation (Signed)
Anesthesia Post Note  Patient: Leah Khan  Procedure(s) Performed: RIGHT SHOULDER ARTHROSCOPY WITH EXTENSIVE DEBRIDEMENT, ACROMIOPLASTY, ROTATOR CUFF REPAIR (Right Shoulder)     Patient location during evaluation: PACU Anesthesia Type: General Level of consciousness: sedated and patient cooperative Pain management: pain level controlled Vital Signs Assessment: post-procedure vital signs reviewed and stable Respiratory status: spontaneous breathing Cardiovascular status: stable Anesthetic complications: no    Last Vitals:  Vitals:   03/15/18 1100 03/15/18 1142  BP: 131/79 (!) 157/82  Pulse: 68 66  Resp: 13 16  Temp:  36.4 C  SpO2: 100% 100%    Last Pain:  Vitals:   03/15/18 1142  TempSrc: Oral  PainSc: 0-No pain                 Lewie Loron

## 2018-03-15 NOTE — Transfer of Care (Signed)
Immediate Anesthesia Transfer of Care Note  Patient: Leah Khan  Procedure(s) Performed: RIGHT SHOULDER ARTHROSCOPY WITH EXTENSIVE DEBRIDEMENT, ACROMIOPLASTY, ROTATOR CUFF REPAIR (Right Shoulder)  Patient Location: PACU  Anesthesia Type:General  Level of Consciousness: awake  Airway & Oxygen Therapy: Patient Spontanous Breathing and Patient connected to face mask oxygen  Post-op Assessment: Report given to RN and Post -op Vital signs reviewed and stable  Post vital signs: Reviewed and stable  Last Vitals:  Vitals Value Taken Time  BP    Temp    Pulse 79 03/15/2018 10:11 AM  Resp 13 03/15/2018 10:11 AM  SpO2 100 % 03/15/2018 10:11 AM  Vitals shown include unvalidated device data.  Last Pain:  Vitals:   03/15/18 0710  TempSrc: Oral  PainSc: 7       Patients Stated Pain Goal: 4 (03/15/18 0710)  Complications: No apparent anesthesia complications

## 2018-03-16 ENCOUNTER — Encounter (HOSPITAL_BASED_OUTPATIENT_CLINIC_OR_DEPARTMENT_OTHER): Payer: Self-pay | Admitting: Orthopaedic Surgery

## 2018-03-20 ENCOUNTER — Telehealth (INDEPENDENT_AMBULATORY_CARE_PROVIDER_SITE_OTHER): Payer: Self-pay | Admitting: Orthopaedic Surgery

## 2018-03-20 NOTE — Telephone Encounter (Signed)
Patient requesting rx refill on hydrocodone, also she said she is in a lot of pain from surgery shes not sure if this is normal or not. Also, she said she would like a note for her job with a return to work date, she hopes she could go back on 9/16. Please advise  # [phone]

## 2018-03-20 NOTE — Telephone Encounter (Signed)
#  30 of percocet.  Hopefully she can but if not, i'm happy to write her out for longer

## 2018-03-20 NOTE — Telephone Encounter (Signed)
Please advise on message. 

## 2018-03-21 ENCOUNTER — Encounter (INDEPENDENT_AMBULATORY_CARE_PROVIDER_SITE_OTHER): Payer: Self-pay

## 2018-03-21 ENCOUNTER — Other Ambulatory Visit (INDEPENDENT_AMBULATORY_CARE_PROVIDER_SITE_OTHER): Payer: Self-pay

## 2018-03-21 MED ORDER — OXYCODONE-ACETAMINOPHEN 5-325 MG PO TABS: 1.0000 | ORAL_TABLET | ORAL | 0 refills | Status: DC | PRN

## 2018-03-21 NOTE — Telephone Encounter (Signed)
rx and letter ready for pick up. Patient aware.

## 2018-03-23 ENCOUNTER — Encounter (INDEPENDENT_AMBULATORY_CARE_PROVIDER_SITE_OTHER): Payer: Self-pay

## 2018-03-23 NOTE — Telephone Encounter (Signed)
Letter made. Ready for pick up. She is aware.

## 2018-03-23 NOTE — Telephone Encounter (Signed)
Patient decided she wouldn't be able to go back on 9/16 that's shes still in pain. She would like an updated note changed and she states needs to say "Leah CritchleyKathy Brogan is out on leave from 9/4-9/20 for full days and 9/23-9/27 half days. And may return full time on 9/30." Patient will pick up note. # [phone]

## 2018-03-23 NOTE — Telephone Encounter (Signed)
See message below °

## 2018-03-23 NOTE — Telephone Encounter (Signed)
No problem.

## 2018-03-29 ENCOUNTER — Ambulatory Visit (INDEPENDENT_AMBULATORY_CARE_PROVIDER_SITE_OTHER): Payer: BC Managed Care – PPO | Admitting: Orthopaedic Surgery

## 2018-03-29 DIAGNOSIS — M75101 Unspecified rotator cuff tear or rupture of right shoulder, not specified as traumatic: Secondary | ICD-10-CM

## 2018-03-29 MED ORDER — HYDROCODONE-ACETAMINOPHEN 7.5-325 MG PO TABS: 1.0000 | ORAL_TABLET | Freq: Two times a day (BID) | ORAL | 0 refills | Status: DC | PRN

## 2018-03-29 NOTE — Progress Notes (Signed)
Post-Op Visit Note   Patient: Leah Khan           Date of Birth: 17-Apr-1966           MRN: [id number] Visit Date: 03/29/2018 PCP: Lupita Raider, MD   Assessment & Plan:  Chief Complaint:  Chief Complaint  Patient presents with  . Right Shoulder - Routine Post Op   Visit Diagnoses:  1. Tear of right supraspinatus tendon     Plan: [name] is two-week status post arthroscopic rotator cuff repair and debridement.  Overall she states that her pain is improved.  She does complain of worse pain at night.  Denies any constitutional symptoms.  Incisions are fully healed.  No signs of infection.  Neurovascular intact distally.  No drainage from incisions.  Today we remove the sutures.  I did give her a prescription for Norco to see if this will agree with her better than the Percocet.  Continue sling and nonweightbearing.  Elbow range of motion twice a day.  Follow-up in 4 weeks for recheck.  Follow-Up Instructions: Return in about 4 weeks (around 04/26/2018).   Orders:  No orders of the defined types were placed in this encounter.  Meds ordered this encounter  Medications  . HYDROcodone-acetaminophen (NORCO) 7.5-325 MG tablet    Sig: Take 1-2 tablets by mouth 2 (two) times daily as needed for moderate pain.    Dispense:  30 tablet    Refill:  0    Imaging: No results found.  PMFS History: Patient Active Problem List   Diagnosis Date Noted  . Impingement syndrome of right shoulder   . Tear of right supraspinatus tendon 01/31/2018  . Chronic right shoulder pain 10/18/2016  . Angioedema 04/30/2015  . Allergic rhinitis 04/30/2015   Past Medical History:  Diagnosis Date  . Angioedema 04/30/2015  . Hypertension   . Migraine   . Rotator cuff disorder, right     Family History  Problem Relation Age of Onset  . Alcoholism Father   . Diabetes Maternal Grandmother     Past Surgical History:  Procedure Laterality Date  . ABDOMINAL HYSTERECTOMY    . BUNIONECTOMY    .  SHOULDER ARTHROSCOPY WITH ROTATOR CUFF REPAIR AND SUBACROMIAL DECOMPRESSION Right 03/15/2018   Procedure: RIGHT SHOULDER ARTHROSCOPY WITH EXTENSIVE DEBRIDEMENT, ACROMIOPLASTY, ROTATOR CUFF REPAIR;  Surgeon: Tarry Kos, MD;  Location:  S[facility]ERY [facility];  Service: Orthopedics;  Laterality: Right;  . TONSILLECTOMY     Social History   Occupational History  . Not on file  Tobacco Use  . Smoking status: Never Smoker  . Smokeless tobacco: Never Used  Substance and Sexual Activity  . Alcohol use: No    Alcohol/week: 0.0 standard drinks  . Drug use: No  . Sexual activity: Not on file

## 2018-04-06 ENCOUNTER — Telehealth (INDEPENDENT_AMBULATORY_CARE_PROVIDER_SITE_OTHER): Payer: Self-pay | Admitting: Orthopaedic Surgery

## 2018-04-06 NOTE — Telephone Encounter (Signed)
Beth at [facility] is requesting patients prescription since patient lost it. Please fax to # [phone]

## 2018-04-07 NOTE — Telephone Encounter (Signed)
FAXED

## 2018-04-17 ENCOUNTER — Other Ambulatory Visit (INDEPENDENT_AMBULATORY_CARE_PROVIDER_SITE_OTHER): Payer: Self-pay

## 2018-04-17 ENCOUNTER — Telehealth (INDEPENDENT_AMBULATORY_CARE_PROVIDER_SITE_OTHER): Payer: Self-pay | Admitting: Orthopaedic Surgery

## 2018-04-17 MED ORDER — TRAMADOL HCL 50 MG PO TABS: 50.0000 mg | ORAL_TABLET | Freq: Three times a day (TID) | ORAL | 0 refills | Status: DC | PRN

## 2018-04-17 NOTE — Telephone Encounter (Signed)
50-100 mg TID prn #60

## 2018-04-17 NOTE — Telephone Encounter (Signed)
Is this okay?  if so, how many and instructions?

## 2018-04-17 NOTE — Telephone Encounter (Signed)
Rx called into pharm 

## 2018-04-17 NOTE — Telephone Encounter (Signed)
Patient called wanting a RX for Tramadol. States other pain meds are too strong.  Please al patient to advise [phone]

## 2018-04-26 ENCOUNTER — Encounter (INDEPENDENT_AMBULATORY_CARE_PROVIDER_SITE_OTHER): Payer: Self-pay | Admitting: Orthopaedic Surgery

## 2018-04-26 ENCOUNTER — Ambulatory Visit (INDEPENDENT_AMBULATORY_CARE_PROVIDER_SITE_OTHER): Payer: BC Managed Care – PPO | Admitting: Orthopaedic Surgery

## 2018-04-26 DIAGNOSIS — M7501 Adhesive capsulitis of right shoulder: Secondary | ICD-10-CM

## 2018-04-26 DIAGNOSIS — M75101 Unspecified rotator cuff tear or rupture of right shoulder, not specified as traumatic: Secondary | ICD-10-CM

## 2018-04-26 MED ORDER — ACETAMINOPHEN-CODEINE #3 300-30 MG PO TABS: 1.0000 | ORAL_TABLET | Freq: Four times a day (QID) | ORAL | 0 refills | Status: DC | PRN

## 2018-04-26 MED ORDER — ZOLPIDEM TARTRATE 5 MG PO TABS: 5.0000 mg | ORAL_TABLET | Freq: Every evening | ORAL | 0 refills | Status: DC | PRN

## 2018-04-26 MED ORDER — METHYLPREDNISOLONE ACETATE 40 MG/ML IJ SUSP: 40.0000 mg | Freq: Once | INTRAMUSCULAR | Status: DC

## 2018-04-26 NOTE — Addendum Note (Signed)
Addended by: Lillia Carmel on: 04/26/2018 03:08 PM   Modules accepted: Orders

## 2018-04-26 NOTE — Progress Notes (Signed)
Post-Op Visit Note   Patient: Leah Khan           Date of Birth: [birth year]           MRN: [id number] Visit Date: 04/26/2018 PCP: Lupita Raider, MD   Assessment & Plan:  Chief Complaint:  Chief Complaint  Patient presents with  . Right Shoulder - Routine Post Op   Visit Diagnoses:  1. Tear of right supraspinatus tendon   2. Adhesive capsulitis of right shoulder     Plan: Leah Khan is two-week status post right shoulder scope and rotator cuff repair.  She states that she mainly has pain at night.  She is feels significant pain and stiffness.  On physical exam she has significant guarding and decreased range of motion consistent with adhesive capsulitis. At this point I am concerned that she will continue to do worse if we do not treat her adhesive capsulitis.  Dr. Prince Rome performed a glenohumeral injection today in hopes of facilitating physical therapy.  I would like to recheck her in 6 weeks.  She would also benefit from a CPM machine to help get her motion going.  I gave her a prescription for Ambien to help her sleep at night.  Follow-Up Instructions: Return in about 6 weeks (around 06/07/2018).   Orders:  No orders of the defined types were placed in this encounter.  Meds ordered this encounter  Medications  . zolpidem (AMBIEN) 5 MG tablet    Sig: Take 1-2 tablets (5-10 mg total) by mouth at bedtime as needed for sleep.    Dispense:  30 tablet    Refill:  0    Imaging: No results found.  PMFS History: Patient Active Problem List   Diagnosis Date Noted  . Adhesive capsulitis of right shoulder 04/26/2018  . Impingement syndrome of right shoulder   . Tear of right supraspinatus tendon 01/31/2018  . Chronic right shoulder pain 10/18/2016  . Angioedema 04/30/2015  . Allergic rhinitis 04/30/2015   Past Medical History:  Diagnosis Date  . Angioedema 04/30/2015  . Hypertension   . Migraine   . Rotator cuff disorder, right     Family History  Problem Relation  Age of Onset  . Alcoholism Father   . Diabetes Maternal Grandmother     Past Surgical History:  Procedure Laterality Date  . ABDOMINAL HYSTERECTOMY    . BUNIONECTOMY    . SHOULDER ARTHROSCOPY WITH ROTATOR CUFF REPAIR AND SUBACROMIAL DECOMPRESSION Right 03/15/2018   Procedure: RIGHT SHOULDER ARTHROSCOPY WITH EXTENSIVE DEBRIDEMENT, ACROMIOPLASTY, ROTATOR CUFF REPAIR;  Surgeon: Tarry Kos, MD;  Location: [facility];  Service: Orthopedics;  Laterality: Right;  . TONSILLECTOMY     Social History   Occupational History  . Not on file  Tobacco Use  . Smoking status: Never Smoker  . Smokeless tobacco: Never Used  Substance and Sexual Activity  . Alcohol use: No    Alcohol/week: 0.0 standard drinks  . Drug use: No  . Sexual activity: Not on file

## 2018-04-26 NOTE — Progress Notes (Signed)
Subjective: She is here with right shoulder pain.  She has adhesive capsulitis status post rotator cuff repair.  She is here for glenohumeral injection.  Objective: Abduction limited to 45 degrees, external rotation 20 degrees, internal rotation 10 degrees.  Substantial pain at the extremes of range of motion.  Procedure: Ultrasound-guided glenohumeral injection: After sterile prep with Betadine, injected 8 cc 1% lidocaine without epinephrine and 40 mg methylprednisolone from posterior approach into the glenohumeral joint using ultrasound to guide needle placement.  Injectate was seen filling the joint capsule.  She had significant improvement in pain and slight improvement in range of motion during the immediate anesthetic phase.  Follow-up with Dr. Roda Shutters as directed.

## 2018-06-07 ENCOUNTER — Encounter (INDEPENDENT_AMBULATORY_CARE_PROVIDER_SITE_OTHER): Payer: Self-pay | Admitting: Orthopaedic Surgery

## 2018-06-07 ENCOUNTER — Ambulatory Visit (INDEPENDENT_AMBULATORY_CARE_PROVIDER_SITE_OTHER): Payer: BC Managed Care – PPO | Admitting: Orthopaedic Surgery

## 2018-06-07 DIAGNOSIS — M7501 Adhesive capsulitis of right shoulder: Secondary | ICD-10-CM

## 2018-06-07 DIAGNOSIS — M75101 Unspecified rotator cuff tear or rupture of right shoulder, not specified as traumatic: Secondary | ICD-10-CM

## 2018-06-07 MED ORDER — DICLOFENAC SODIUM 75 MG PO TBEC: 75.0000 mg | DELAYED_RELEASE_TABLET | Freq: Two times a day (BID) | ORAL | 6 refills | Status: DC

## 2018-06-07 MED ORDER — ACETAMINOPHEN-CODEINE #3 300-30 MG PO TABS: 1.0000 | ORAL_TABLET | Freq: Every day | ORAL | 0 refills | Status: AC | PRN

## 2018-06-07 NOTE — Progress Notes (Signed)
Post-Op Visit Note   Patient: Leah Khan           Date of Birth: 12-13-1965           MRN: [id number] Visit Date: 06/07/2018 PCP: Lupita Raider, MD   Assessment & Plan:  Chief Complaint:  Chief Complaint  Patient presents with  . Right Shoulder - Follow-up   Visit Diagnoses:  1. Tear of right supraspinatus tendon   2. Adhesive capsulitis of right shoulder     Plan: [name] is nearly 3 months status post right shoulder arthroscopy and rotator cuff repair and debridement.  Unfortunately she developed severe adhesive capsulitis during her postoperative course.  She has been working really hard with physical therapy with minimal improvement in range of motion.  She does have constant mild pain that is worse with any movement.  Dr. Prince Rome performed intra-articular steroid injection 6 weeks ago. Today on exam her surgical scars are fully healed.  Her forward flexion is 95 degrees, external rotation 0 degrees, internal rotation side of her right hip.  Rotator cuff testing is intact and without significant pain.  At this point we discussed continuing physical therapy and repeating another cortisone injection.  We will also see if we can get her an ERMI device to help with range of motion.  The CPM that we prescribed was too expensive for her.  We did discuss the possibility of manipulation and lysis of adhesions if she fails to improve.  All questions answered to her satisfaction.  I would like to recheck her in 6 weeks.  Follow-Up Instructions: Return in about 6 weeks (around 07/19/2018).   Orders:  No orders of the defined types were placed in this encounter.  Meds ordered this encounter  Medications  . diclofenac (VOLTAREN) 75 MG EC tablet    Sig: Take 1 tablet (75 mg total) by mouth 2 (two) times daily.    Dispense:  60 tablet    Refill:  6  . acetaminophen-codeine (TYLENOL #3) 300-30 MG tablet    Sig: Take 1-2 tablets by mouth daily as needed for up to 7 days for moderate pain.      Dispense:  30 tablet    Refill:  0    Imaging: No results found.  PMFS History: Patient Active Problem List   Diagnosis Date Noted  . Adhesive capsulitis of right shoulder 04/26/2018  . Impingement syndrome of right shoulder   . Tear of right supraspinatus tendon 01/31/2018  . Chronic right shoulder pain 10/18/2016  . Angioedema 04/30/2015  . Allergic rhinitis 04/30/2015   Past Medical History:  Diagnosis Date  . Angioedema 04/30/2015  . Hypertension   . Migraine   . Rotator cuff disorder, right     Family History  Problem Relation Age of Onset  . Alcoholism Father   . Diabetes Maternal Grandmother     Past Surgical History:  Procedure Laterality Date  . ABDOMINAL HYSTERECTOMY    . BUNIONECTOMY    . SHOULDER ARTHROSCOPY WITH ROTATOR CUFF REPAIR AND SUBACROMIAL DECOMPRESSION Right 03/15/2018   Procedure: RIGHT SHOULDER ARTHROSCOPY WITH EXTENSIVE DEBRIDEMENT, ACROMIOPLASTY, ROTATOR CUFF REPAIR;  Surgeon: Tarry Kos, MD;  Location: [facility];  Service: Orthopedics;  Laterality: Right;  . TONSILLECTOMY     Social History   Occupational History  . Not on file  Tobacco Use  . Smoking status: Never Smoker  . Smokeless tobacco: Never Used  Substance and Sexual Activity  . Alcohol use: No  Alcohol/week: 0.0 standard drinks  . Drug use: No  . Sexual activity: Not on file

## 2018-06-07 NOTE — Progress Notes (Signed)
Subjective: She is here with persistent adhesive capsulitis right shoulder.  We are going to try another glenohumeral injection.  Objective: Very limited active and passive range of motion of her right shoulder.  Procedure: Ultrasound-guided right glenohumeral injection: After sterile prep with Betadine, injected 8 cc 1% lidocaine without epinephrine and 40 mg methylprednisolone using a 22-gauge spinal needle, passing the needle into the posterior glenohumeral joint.  Injectate was seen filling the joint capsule.  Follow-up as directed.

## 2018-07-19 ENCOUNTER — Ambulatory Visit (INDEPENDENT_AMBULATORY_CARE_PROVIDER_SITE_OTHER): Payer: BC Managed Care – PPO | Admitting: Orthopaedic Surgery

## 2018-07-26 ENCOUNTER — Ambulatory Visit (INDEPENDENT_AMBULATORY_CARE_PROVIDER_SITE_OTHER): Payer: BLUE CROSS/BLUE SHIELD | Admitting: Orthopaedic Surgery

## 2018-07-26 DIAGNOSIS — M7501 Adhesive capsulitis of right shoulder: Secondary | ICD-10-CM | POA: Diagnosis not present

## 2018-07-26 MED ORDER — CYCLOBENZAPRINE HCL 5 MG PO TABS: 5.0000 mg | ORAL_TABLET | Freq: Three times a day (TID) | ORAL | 3 refills | Status: DC | PRN

## 2018-07-26 MED ORDER — IBUPROFEN 800 MG PO TABS: 800.0000 mg | ORAL_TABLET | Freq: Three times a day (TID) | ORAL | 2 refills | Status: DC | PRN

## 2018-07-26 NOTE — Progress Notes (Signed)
Office Visit Note   Patient: Leah Khan           Date of Birth: 1965/11/01           MRN: [id number] Visit Date: 07/26/2018              Requested by: Lupita Raider, MD [address] [place], [place] [address] PCP: Lupita Raider, MD   Assessment & Plan: Visit Diagnoses:  1. Adhesive capsulitis of right shoulder     Plan: Unfortunately Leah Khan continues to struggle from significant adhesive capsulitis.  I do not feel that she is making any progress.  I have discussed and recommended shoulder manipulation with arthroscopic lysis of adhesions and the associated recovery and the risks and benefits.  Patient will take time to think about this.  In the meantime I have given her prescription for Flexeril and ibuprofen.  She will continue with home exercises to help immobilize her joint.  Follow-Up Instructions: Return if symptoms worsen or fail to improve.   Orders:  No orders of the defined types were placed in this encounter.  Meds ordered this encounter  Medications  . cyclobenzaprine (FLEXERIL) 5 MG tablet    Sig: Take 1-2 tablets (5-10 mg total) by mouth 3 (three) times daily as needed for muscle spasms.    Dispense:  30 tablet    Refill:  3  . ibuprofen (ADVIL,MOTRIN) 800 MG tablet    Sig: Take 1 tablet (800 mg total) by mouth every 8 (eight) hours as needed.    Dispense:  60 tablet    Refill:  2      Procedures: No procedures performed   Clinical Data: No additional findings.   Subjective: Chief Complaint  Patient presents with  . Right Shoulder - Routine Post Op, Pain, Follow-up    Leah Khan returns today for right shoulder adhesive capsulitis.  She has been using the shoulder during the manipulation device but she is only made minimal improvement.  She has temporary improvement from physical therapy but she continues to have constant aching pain at rest.   Review of Systems   Objective: Vital Signs: There were no vitals taken for this  visit.  Physical Exam  Ortho Exam Right shoulder exam shows forward flexion 95 degrees external rotation to 10 degrees.  Internal rotation to the right hip.  Manual muscle testing is grossly intact. Specialty Comments:  No specialty comments available.  Imaging: No results found.   PMFS History: Patient Active Problem List   Diagnosis Date Noted  . Adhesive capsulitis of right shoulder 04/26/2018  . Impingement syndrome of right shoulder   . Tear of right supraspinatus tendon 01/31/2018  . Chronic right shoulder pain 10/18/2016  . Angioedema 04/30/2015  . Allergic rhinitis 04/30/2015   Past Medical History:  Diagnosis Date  . Angioedema 04/30/2015  . Hypertension   . Migraine   . Rotator cuff disorder, right     Family History  Problem Relation Age of Onset  . Alcoholism Father   . Diabetes Maternal Grandmother     Past Surgical History:  Procedure Laterality Date  . ABDOMINAL HYSTERECTOMY    . BUNIONECTOMY    . SHOULDER ARTHROSCOPY WITH ROTATOR CUFF REPAIR AND SUBACROMIAL DECOMPRESSION Right 03/15/2018   Procedure: RIGHT SHOULDER ARTHROSCOPY WITH EXTENSIVE DEBRIDEMENT, ACROMIOPLASTY, ROTATOR CUFF REPAIR;  Surgeon: Tarry Kos, MD;  Location: [facility];  Service: Orthopedics;  Laterality: Right;  . TONSILLECTOMY     Social History  Occupational History  . Not on file  Tobacco Use  . Smoking status: Never Smoker  . Smokeless tobacco: Never Used  Substance and Sexual Activity  . Alcohol use: No    Alcohol/week: 0.0 standard drinks  . Drug use: No  . Sexual activity: Not on file

## 2018-09-06 ENCOUNTER — Encounter (HOSPITAL_BASED_OUTPATIENT_CLINIC_OR_DEPARTMENT_OTHER): Payer: Self-pay | Admitting: *Deleted

## 2018-09-06 ENCOUNTER — Other Ambulatory Visit: Payer: Self-pay

## 2018-09-13 ENCOUNTER — Other Ambulatory Visit (INDEPENDENT_AMBULATORY_CARE_PROVIDER_SITE_OTHER): Payer: Self-pay

## 2018-09-13 ENCOUNTER — Ambulatory Visit (HOSPITAL_BASED_OUTPATIENT_CLINIC_OR_DEPARTMENT_OTHER)
Admission: RE | Admit: 2018-09-13 | Discharge: 2018-09-13 | Disposition: A | Discharge status: Home / Self Care | Payer: BLUE CROSS/BLUE SHIELD | Attending: Orthopaedic Surgery | Admitting: Orthopaedic Surgery

## 2018-09-13 ENCOUNTER — Encounter (HOSPITAL_BASED_OUTPATIENT_CLINIC_OR_DEPARTMENT_OTHER)
Admission: RE | Disposition: A | Discharge status: Home / Self Care | Payer: Self-pay | Source: Home / Self Care | Attending: Orthopaedic Surgery

## 2018-09-13 ENCOUNTER — Encounter: Payer: Self-pay | Admitting: Orthopaedic Surgery

## 2018-09-13 ENCOUNTER — Encounter (HOSPITAL_BASED_OUTPATIENT_CLINIC_OR_DEPARTMENT_OTHER): Payer: Self-pay | Admitting: Anesthesiology

## 2018-09-13 ENCOUNTER — Ambulatory Visit (HOSPITAL_BASED_OUTPATIENT_CLINIC_OR_DEPARTMENT_OTHER): Payer: BLUE CROSS/BLUE SHIELD | Admitting: Anesthesiology

## 2018-09-13 ENCOUNTER — Other Ambulatory Visit: Payer: Self-pay

## 2018-09-13 ENCOUNTER — Telehealth (INDEPENDENT_AMBULATORY_CARE_PROVIDER_SITE_OTHER): Payer: Self-pay | Admitting: Orthopaedic Surgery

## 2018-09-13 DIAGNOSIS — Z888 Allergy status to other drugs, medicaments and biological substances status: Secondary | ICD-10-CM | POA: Insufficient documentation

## 2018-09-13 DIAGNOSIS — Z79899 Other long term (current) drug therapy: Secondary | ICD-10-CM | POA: Insufficient documentation

## 2018-09-13 DIAGNOSIS — Z87892 Personal history of anaphylaxis: Secondary | ICD-10-CM | POA: Insufficient documentation

## 2018-09-13 DIAGNOSIS — M7501 Adhesive capsulitis of right shoulder: Secondary | ICD-10-CM

## 2018-09-13 DIAGNOSIS — M7551 Bursitis of right shoulder: Secondary | ICD-10-CM | POA: Diagnosis not present

## 2018-09-13 DIAGNOSIS — G43909 Migraine, unspecified, not intractable, without status migrainosus: Secondary | ICD-10-CM | POA: Insufficient documentation

## 2018-09-13 DIAGNOSIS — I10 Essential (primary) hypertension: Secondary | ICD-10-CM | POA: Insufficient documentation

## 2018-09-13 HISTORY — DX: Adhesive capsulitis of right shoulder: M75.01

## 2018-09-13 HISTORY — PX: SHOULDER ARTHROSCOPY WITH SUBACROMIAL DECOMPRESSION: SHX5684

## 2018-09-13 SURGERY — SHOULDER ARTHROSCOPY WITH SUBACROMIAL DECOMPRESSION
Anesthesia: General | Site: Shoulder | Laterality: Right

## 2018-09-13 MED ORDER — OXYCODONE-ACETAMINOPHEN 5-325 MG PO TABS: 1.0000 | ORAL_TABLET | Freq: Four times a day (QID) | ORAL | 0 refills | Status: DC | PRN

## 2018-09-13 MED ORDER — DEXAMETHASONE SODIUM PHOSPHATE 4 MG/ML IJ SOLN
INTRAMUSCULAR | Status: DC | PRN
  Administered 2018-09-13: 10 mg via INTRAVENOUS

## 2018-09-13 MED ORDER — MEPERIDINE HCL 25 MG/ML IJ SOLN: 6.2500 mg | INTRAMUSCULAR | Status: DC | PRN

## 2018-09-13 MED ORDER — CHLORHEXIDINE GLUCONATE 4 % EX LIQD: 60.0000 mL | Freq: Once | CUTANEOUS | Status: DC

## 2018-09-13 MED ORDER — SODIUM CHLORIDE 0.9 % IR SOLN
Status: DC | PRN
  Administered 2018-09-13: 6000 mL

## 2018-09-13 MED ORDER — MIDAZOLAM HCL 2 MG/2ML IJ SOLN
2.0000 mg | Freq: Once | INTRAMUSCULAR | Status: AC
  Administered 2018-09-13: 2 mg via INTRAVENOUS

## 2018-09-13 MED ORDER — BUPIVACAINE HCL (PF) 0.5 % IJ SOLN
INTRAMUSCULAR | Status: DC | PRN
  Administered 2018-09-13: 15 mL

## 2018-09-13 MED ORDER — CEFAZOLIN SODIUM-DEXTROSE 2-4 GM/100ML-% IV SOLN
INTRAVENOUS | Status: AC
  Filled 2018-09-13: qty 100

## 2018-09-13 MED ORDER — PROPOFOL 500 MG/50ML IV EMUL
INTRAVENOUS | Status: AC
  Filled 2018-09-13: qty 50

## 2018-09-13 MED ORDER — PHENYLEPHRINE HCL 10 MG/ML IJ SOLN
INTRAMUSCULAR | Status: DC | PRN
  Administered 2018-09-13 (×4): 80 ug via INTRAVENOUS

## 2018-09-13 MED ORDER — PHENYLEPHRINE 40 MCG/ML (10ML) SYRINGE FOR IV PUSH (FOR BLOOD PRESSURE SUPPORT)
PREFILLED_SYRINGE | INTRAVENOUS | Status: AC
  Filled 2018-09-13: qty 20

## 2018-09-13 MED ORDER — FENTANYL CITRATE (PF) 100 MCG/2ML IJ SOLN: 25.0000 ug | INTRAMUSCULAR | Status: DC | PRN

## 2018-09-13 MED ORDER — LACTATED RINGERS IV SOLN
INTRAVENOUS | Status: DC
  Administered 2018-09-13: 08:00:00 via INTRAVENOUS

## 2018-09-13 MED ORDER — LIDOCAINE 2% (20 MG/ML) 5 ML SYRINGE
INTRAMUSCULAR | Status: DC | PRN
  Administered 2018-09-13: 50 mg via INTRAVENOUS

## 2018-09-13 MED ORDER — PROMETHAZINE HCL 25 MG PO TABS: 25.0000 mg | ORAL_TABLET | Freq: Four times a day (QID) | ORAL | 1 refills | Status: DC | PRN

## 2018-09-13 MED ORDER — MIDAZOLAM HCL 2 MG/2ML IJ SOLN
INTRAMUSCULAR | Status: AC
  Filled 2018-09-13: qty 2

## 2018-09-13 MED ORDER — CEFAZOLIN SODIUM 1 G IJ SOLR
INTRAMUSCULAR | Status: AC
  Filled 2018-09-13: qty 10

## 2018-09-13 MED ORDER — LIDOCAINE 2% (20 MG/ML) 5 ML SYRINGE
INTRAMUSCULAR | Status: AC
  Filled 2018-09-13: qty 5

## 2018-09-13 MED ORDER — CEFAZOLIN SODIUM-DEXTROSE 2-4 GM/100ML-% IV SOLN
2.0000 g | INTRAVENOUS | Status: AC
  Administered 2018-09-13: 2 g via INTRAVENOUS

## 2018-09-13 MED ORDER — PROPOFOL 10 MG/ML IV BOLUS
INTRAVENOUS | Status: AC
  Filled 2018-09-13: qty 20

## 2018-09-13 MED ORDER — LACTATED RINGERS IV SOLN
INTRAVENOUS | Status: DC
  Administered 2018-09-13 (×3): via INTRAVENOUS

## 2018-09-13 MED ORDER — EPHEDRINE SULFATE 50 MG/ML IJ SOLN
INTRAMUSCULAR | Status: DC | PRN
  Administered 2018-09-13 (×2): 25 mg via INTRAVENOUS

## 2018-09-13 MED ORDER — BUPIVACAINE LIPOSOME 1.3 % IJ SUSP
INTRAMUSCULAR | Status: DC | PRN
  Administered 2018-09-13: 10 mL via PERINEURAL

## 2018-09-13 MED ORDER — EPHEDRINE 5 MG/ML INJ
INTRAVENOUS | Status: AC
  Filled 2018-09-13: qty 10

## 2018-09-13 MED ORDER — SUCCINYLCHOLINE CHLORIDE 20 MG/ML IJ SOLN
INTRAMUSCULAR | Status: DC | PRN
  Administered 2018-09-13: 50 mg via INTRAVENOUS

## 2018-09-13 MED ORDER — ONDANSETRON HCL 4 MG/2ML IJ SOLN
INTRAMUSCULAR | Status: AC
  Filled 2018-09-13: qty 2

## 2018-09-13 MED ORDER — MIDAZOLAM HCL 2 MG/2ML IJ SOLN
1.0000 mg | INTRAMUSCULAR | Status: DC | PRN
  Administered 2018-09-13: 2 mg via INTRAVENOUS

## 2018-09-13 MED ORDER — METOCLOPRAMIDE HCL 5 MG/ML IJ SOLN: 10.0000 mg | Freq: Once | INTRAMUSCULAR | Status: DC | PRN

## 2018-09-13 MED ORDER — FENTANYL CITRATE (PF) 100 MCG/2ML IJ SOLN
100.0000 ug | Freq: Once | INTRAMUSCULAR | Status: AC
  Administered 2018-09-13: 100 ug via INTRAVENOUS

## 2018-09-13 MED ORDER — FENTANYL CITRATE (PF) 100 MCG/2ML IJ SOLN
INTRAMUSCULAR | Status: AC
  Filled 2018-09-13: qty 2

## 2018-09-13 MED ORDER — PROPOFOL 10 MG/ML IV BOLUS
INTRAVENOUS | Status: DC | PRN
  Administered 2018-09-13: 150 mg via INTRAVENOUS

## 2018-09-13 MED ORDER — ONDANSETRON HCL 4 MG/2ML IJ SOLN
INTRAMUSCULAR | Status: DC | PRN
  Administered 2018-09-13: 4 mg via INTRAVENOUS

## 2018-09-13 MED ORDER — SUCCINYLCHOLINE CHLORIDE 200 MG/10ML IV SOSY
PREFILLED_SYRINGE | INTRAVENOUS | Status: AC
  Filled 2018-09-13: qty 10

## 2018-09-13 MED ORDER — FENTANYL CITRATE (PF) 100 MCG/2ML IJ SOLN
50.0000 ug | INTRAMUSCULAR | Status: DC | PRN
  Administered 2018-09-13: 100 ug via INTRAVENOUS

## 2018-09-13 MED ORDER — FENTANYL CITRATE (PF) 100 MCG/2ML IJ SOLN
INTRAMUSCULAR | Status: DC | PRN
  Administered 2018-09-13: 50 ug via INTRAVENOUS

## 2018-09-13 MED ORDER — DEXAMETHASONE SODIUM PHOSPHATE 10 MG/ML IJ SOLN
INTRAMUSCULAR | Status: AC
  Filled 2018-09-13: qty 1

## 2018-09-13 MED ORDER — SUFENTANIL CITRATE 50 MCG/ML IV SOLN
INTRAVENOUS | Status: AC
  Filled 2018-09-13: qty 1

## 2018-09-13 MED ORDER — SCOPOLAMINE 1 MG/3DAYS TD PT72: 1.0000 | MEDICATED_PATCH | Freq: Once | TRANSDERMAL | Status: DC | PRN

## 2018-09-13 MED ORDER — SODIUM CHLORIDE (PF) 0.9 % IJ SOLN
INTRAMUSCULAR | Status: AC
  Filled 2018-09-13: qty 10

## 2018-09-13 MED ORDER — LACTATED RINGERS IV SOLN: INTRAVENOUS | Status: DC

## 2018-09-13 MED ORDER — PHENYLEPHRINE 40 MCG/ML (10ML) SYRINGE FOR IV PUSH (FOR BLOOD PRESSURE SUPPORT)
PREFILLED_SYRINGE | INTRAVENOUS | Status: AC
  Filled 2018-09-13: qty 10

## 2018-09-13 SURGICAL SUPPLY — 68 items
BENZOIN TINCTURE PRP APPL 2/3 (GAUZE/BANDAGES/DRESSINGS) IMPLANT
BLADE 4.2CUDA (BLADE) ×6 IMPLANT
BLADE CUTTER GATOR 3.5 (BLADE) IMPLANT
BLADE GREAT WHITE 4.2 (BLADE) IMPLANT
BLADE GREAT WHITE 4.2MM (BLADE)
BLADE LANZA CVD 15 DEG (BLADE) IMPLANT
BLADE SURG 15 STRL LF DISP TIS (BLADE) ×1 IMPLANT
BLADE SURG 15 STRL SS (BLADE) ×2
BUR OVAL 4.0 (BURR) ×3 IMPLANT
CANNULA 5.75X71 LONG (CANNULA) ×3 IMPLANT
CANNULA SHOULDER 7CM (CANNULA) ×3 IMPLANT
CANNULA TWIST IN 8.25X7CM (CANNULA) IMPLANT
CANNULA TWIST IN 8.25X9CM (CANNULA) IMPLANT
CHLORAPREP W/TINT 26 (MISCELLANEOUS) ×6 IMPLANT
CLOSURE STERI-STRIP 1/2X4 (GAUZE/BANDAGES/DRESSINGS) ×1
CLSR STERI-STRIP ANTIMIC 1/2X4 (GAUZE/BANDAGES/DRESSINGS) ×2 IMPLANT
COVER WAND RF STERILE (DRAPES) IMPLANT
DECANTER SPIKE VIAL GLASS SM (MISCELLANEOUS) IMPLANT
DRAPE IMP U-DRAPE 54X76 (DRAPES) ×3 IMPLANT
DRAPE INCISE IOBAN 66X45 STRL (DRAPES) ×3 IMPLANT
DRAPE STERI 35X30 U-POUCH (DRAPES) ×3 IMPLANT
DRAPE SURG 17X23 STRL (DRAPES) ×3 IMPLANT
DRAPE U-SHAPE 47X51 STRL (DRAPES) ×3 IMPLANT
DRAPE U-SHAPE 76X120 STRL (DRAPES) ×6 IMPLANT
DRSG PAD ABDOMINAL 8X10 ST (GAUZE/BANDAGES/DRESSINGS) ×6 IMPLANT
DURAPREP 26ML APPLICATOR (WOUND CARE) IMPLANT
ELECT REM PT RETURN 9FT ADLT (ELECTROSURGICAL) ×3
ELECTRODE REM PT RTRN 9FT ADLT (ELECTROSURGICAL) ×1 IMPLANT
GAUZE SPONGE 4X4 12PLY STRL (GAUZE/BANDAGES/DRESSINGS) ×6 IMPLANT
GAUZE XEROFORM 1X8 LF (GAUZE/BANDAGES/DRESSINGS) ×3 IMPLANT
GLOVE BIOGEL PI IND STRL 7.0 (GLOVE) ×1 IMPLANT
GLOVE BIOGEL PI INDICATOR 7.0 (GLOVE) ×2
GLOVE ECLIPSE 7.0 STRL STRAW (GLOVE) ×6 IMPLANT
GLOVE SKINSENSE NS SZ7.5 (GLOVE) ×4
GLOVE SKINSENSE STRL SZ7.5 (GLOVE) ×2 IMPLANT
GLOVE SURG SYN 7.5  E (GLOVE) ×10
GLOVE SURG SYN 7.5 E (GLOVE) ×5 IMPLANT
GOWN STRL REIN XL XLG (GOWN DISPOSABLE) ×3 IMPLANT
GOWN STRL REUS W/ TWL LRG LVL3 (GOWN DISPOSABLE) ×1 IMPLANT
GOWN STRL REUS W/ TWL XL LVL3 (GOWN DISPOSABLE) ×1 IMPLANT
GOWN STRL REUS W/TWL LRG LVL3 (GOWN DISPOSABLE) ×2
GOWN STRL REUS W/TWL XL LVL3 (GOWN DISPOSABLE) ×2
IMMOBILIZER SHOULDER FOAM XLGE (SOFTGOODS) IMPLANT
KIT SHOULDER TRACTION (DRAPES) ×3 IMPLANT
MANIFOLD NEPTUNE II (INSTRUMENTS) ×3 IMPLANT
NEEDLE SCORPION MULTI FIRE (NEEDLE) IMPLANT
PACK ARTHROSCOPY DSU (CUSTOM PROCEDURE TRAY) ×3 IMPLANT
PACK BASIN DAY SURGERY FS (CUSTOM PROCEDURE TRAY) ×3 IMPLANT
PROBE BIPOLAR ATHRO 135MM 90D (MISCELLANEOUS) ×6 IMPLANT
PROBE HOOK APOLLO (SURGICAL WAND) ×3 IMPLANT
SHEET MEDIUM DRAPE 40X70 STRL (DRAPES) ×3 IMPLANT
SLEEVE SCD COMPRESS KNEE MED (MISCELLANEOUS) ×3 IMPLANT
SLING ARM FOAM STRAP LRG (SOFTGOODS) IMPLANT
SLING ARM IMMOBILIZER LRG (SOFTGOODS) IMPLANT
SLING ARM IMMOBILIZER MED (SOFTGOODS) IMPLANT
SLING ARM MED ADULT FOAM STRAP (SOFTGOODS) ×3 IMPLANT
SLING ARM XL FOAM STRAP (SOFTGOODS) IMPLANT
SUT ETHILON 3 0 PS 1 (SUTURE) ×3 IMPLANT
SUT FIBERWIRE #2 38 T-5 BLUE (SUTURE)
SUT TIGER TAPE 7 IN WHITE (SUTURE) IMPLANT
SUTURE FIBERWR #2 38 T-5 BLUE (SUTURE) IMPLANT
SYR 50ML LL SCALE MARK (SYRINGE) IMPLANT
TAPE FIBER 2MM 7IN #2 BLUE (SUTURE) IMPLANT
TOWEL GREEN STERILE FF (TOWEL DISPOSABLE) ×3 IMPLANT
TUBE CONNECTING 20'X1/4 (TUBING)
TUBE CONNECTING 20X1/4 (TUBING) IMPLANT
TUBING ARTHRO INFLOW-ONLY STRL (TUBING) ×3 IMPLANT
WATER STERILE IRR 1000ML POUR (IV SOLUTION) ×3 IMPLANT

## 2018-09-13 NOTE — Discharge Instructions (Signed)
  Post-operative patient instructions  Shoulder Arthroscopy   . Ice:  Place intermittent ice or cooler pack over your shoulder, 30 minutes on and 30 minutes off.  Continue this for the first 72 hours after surgery, then save ice for use after therapy sessions or on more active days.   . Weight:  You may bear weight on your arm as your symptoms allow. . Motion:  Perform gentle shoulder motion as tolerated . Dressing:  Perform 1st dressing change at 2 days postoperative. A moderate amount of blood tinged drainage is to be expected.  So if you bleed through the dressing on the first or second day or if you have fevers, it is fine to change the dressing/check the wounds early and redress wound.  If it bleeds through again, or if the incisions are leaking frank blood, please call the office. May change dressing every 1-2 days thereafter to help watch wounds. Can purchase Tegaderm (or 3M Nexcare) water resistant dressings at local pharmacy / [facility]. . Shower:  Light shower is ok after 2 days.  Please take shower, NO bath. Recover with gauze and ace wrap to help keep wounds protected.   . Pain medication:  A narcotic pain medication has been prescribed.  Take as directed.  Typically you need narcotic pain medication more regularly during the first 3 to 5 days after surgery.  Decrease your use of the medication as the pain improves.  Narcotics can sometimes cause constipation, even after a few doses.  If you have problems with constipation, you can take an over the counter stool softener or light laxative.  If you have persistent problems, please notify your physician's office. . Physical therapy: Additional activity guidelines to be provided by your physician or physical therapist at follow-up visits.  . Driving: Do not recommend driving x 2 weeks post surgical, especially if surgery performed on right side. Should not drive while taking narcotic pain medications. It typically takes at least 2 weeks to  restore sufficient neuromuscular function for normal reaction times for driving safety.  . Call [phone] for questions or problems. Evenings you will be forwarded to the hospital operator.  Ask for the orthopaedic physician on call. Please call if you experience:    o Redness, foul smelling, or persistent drainage from the surgical site  o worsening shoulder pain and swelling not responsive to medication  o any calf pain and or swelling of the lower leg  o temperatures greater than 101.5 F o other questions or concerns   Thank you for allowing us to be a part of your care.    Post Anesthesia Home Care Instructions  Activity: Get plenty of rest for the remainder of the day. A responsible individual must stay with you for 24 hours following the procedure.  For the next 24 hours, DO NOT: -Drive a car -Operate machinery -Drink alcoholic beverages -Take any medication unless instructed by your physician -Make any legal decisions or sign important papers.  Meals: Start with liquid foods such as gelatin or soup. Progress to regular foods as tolerated. Avoid greasy, spicy, heavy foods. If nausea and/or vomiting occur, drink only clear liquids until the nausea and/or vomiting subsides. Call your physician if vomiting continues.  Special Instructions/Symptoms: Your throat may feel dry or sore from the anesthesia or the breathing tube placed in your throat during surgery. If this causes discomfort, gargle with warm salt water. The discomfort should disappear within 24 hours.  If you had a scopolamine patch   placed behind your ear for the management of post- operative nausea and/or vomiting:  1. The medication in the patch is effective for 72 hours, after which it should be removed.  Wrap patch in a tissue and discard in the trash. Wash hands thoroughly with soap and water. 2. You may remove the patch earlier than 72 hours if you experience unpleasant side effects which may include dry  mouth, dizziness or visual disturbances. 3. Avoid touching the patch. Wash your hands with soap and water after contact with the patch.    Regional Anesthesia Blocks  1. Numbness or the inability to move the "blocked" extremity may last from 3-48 hours after placement. The length of time depends on the medication injected and your individual response to the medication. If the numbness is not going away after 48 hours, call your surgeon.  2. The extremity that is blocked will need to be protected until the numbness is gone and the  Strength has returned. Because you cannot feel it, you will need to take extra care to avoid injury. Because it may be weak, you may have difficulty moving it or using it. You may not know what position it is in without looking at it while the block is in effect.  3. For blocks in the legs and feet, returning to weight bearing and walking needs to be done carefully. You will need to wait until the numbness is entirely gone and the strength has returned. You should be able to move your leg and foot normally before you try and bear weight or walk. You will need someone to be with you when you first try to ensure you do not fall and possibly risk injury.  4. Bruising and tenderness at the needle site are common side effects and will resolve in a few days.  5. Persistent numbness or new problems with movement should be communicated to the surgeon or the  Surgery Center ([phone])/ [facility] ([phone]).Information for Discharge Teaching: EXPAREL (bupivacaine liposome injectable suspension)   Your surgeon or anesthesiologist gave you EXPAREL(bupivacaine) to help control your pain after surgery.   EXPAREL is a local anesthetic that provides pain relief by numbing the tissue around the surgical site.  EXPAREL is designed to release pain medication over time and can control pain for up to 72 hours.  Depending on how you respond to EXPAREL, you  may require less pain medication during your recovery.  Possible side effects:  Temporary loss of sensation or ability to move in the area where bupivacaine was injected.  Nausea, vomiting, constipation  Rarely, numbness and tingling in your mouth or lips, lightheadedness, or anxiety may occur.  Call your doctor right away if you think you may be experiencing any of these sensations, or if you have other questions regarding possible side effects.  Follow all other discharge instructions given to you by your surgeon or nurse. Eat a healthy diet and drink plenty of water or other fluids.  If you return to the hospital for any reason within 96 hours following the administration of EXPAREL, it is important for health care providers to know that you have received this anesthetic. A teal colored band has been placed on your arm with the date, time and amount of EXPAREL you have received in order to alert and inform your health care providers. Please leave this armband in place for the full 96 hours following administration, and then you may remove the band. 

## 2018-09-13 NOTE — Transfer of Care (Signed)
Immediate Anesthesia Transfer of Care Note  Patient: Leah Khan  Procedure(s) Performed: RIGHT SHOULDER ARTHROSCOPY WITH LYSIS OF ADHESIONS, SUBACROMIAL DECOMPRESSION AND MANIPULATION UNDER ANESTHESIA (Right Shoulder)  Patient Location: PACU  Anesthesia Type:GA combined with regional for post-op pain  Level of Consciousness: drowsy  Airway & Oxygen Therapy: Patient Spontanous Breathing and Patient connected to face mask oxygen  Post-op Assessment: Report given to RN and Post -op Vital signs reviewed and stable  Post vital signs: Reviewed and stable  Last Vitals:  Vitals Value Taken Time  BP 118/67 09/13/2018 11:05 AM  Temp    Pulse 73 09/13/2018 11:08 AM  Resp 14 09/13/2018 11:08 AM  SpO2 100 % 09/13/2018 11:08 AM  Vitals shown include unvalidated device data.  Last Pain:  Vitals:   09/13/18 0905  TempSrc:   PainSc: 0-No pain         Complications: No apparent anesthesia complications

## 2018-09-13 NOTE — Anesthesia Preprocedure Evaluation (Signed)
Anesthesia Evaluation  Patient identified by MRN, date of birth, ID band Patient awake    Reviewed: Allergy & Precautions, NPO status , Patient's Chart, lab work & pertinent test results  Airway Mallampati: II  TM Distance: >3 FB Neck ROM: Full    Dental no notable dental hx.    Pulmonary neg pulmonary ROS,    Pulmonary exam normal breath sounds clear to auscultation       Cardiovascular hypertension, negative cardio ROS Normal cardiovascular exam Rhythm:Regular Rate:Normal     Neuro/Psych negative neurological ROS  negative psych ROS   GI/Hepatic negative GI ROS, Neg liver ROS,   Endo/Other  negative endocrine ROS  Renal/GU negative Renal ROS  negative genitourinary   Musculoskeletal negative musculoskeletal ROS (+)   Abdominal   Peds negative pediatric ROS (+)  Hematology negative hematology ROS (+)   Anesthesia Other Findings   Reproductive/Obstetrics negative OB ROS                             Anesthesia Physical Anesthesia Plan  ASA: II  Anesthesia Plan: General   Post-op Pain Management:  Regional for Post-op pain   Induction: Intravenous  PONV Risk Score and Plan: 3 and Ondansetron, Dexamethasone and Treatment may vary due to age or medical condition  Airway Management Planned: Oral ETT  Additional Equipment:   Intra-op Plan:   Post-operative Plan: Extubation in OR  Informed Consent: I have reviewed the patients History and Physical, chart, labs and discussed the procedure including the risks, benefits and alternatives for the proposed anesthesia with the patient or authorized representative who has indicated his/her understanding and acceptance.     Dental advisory given  Plan Discussed with: CRNA  Anesthesia Plan Comments:         Anesthesia Quick Evaluation

## 2018-09-13 NOTE — Telephone Encounter (Signed)
Patient called to say she spoke with PT @ [facility] and was told she could not start tomorrow. Please call

## 2018-09-13 NOTE — Anesthesia Postprocedure Evaluation (Signed)
Anesthesia Post Note  Patient: Leah Khan  Procedure(s) Performed: RIGHT SHOULDER ARTHROSCOPY WITH LYSIS OF ADHESIONS, SUBACROMIAL DECOMPRESSION AND MANIPULATION UNDER ANESTHESIA (Right Shoulder)     Patient location during evaluation: PACU Anesthesia Type: General Level of consciousness: awake and alert Pain management: pain level controlled Vital Signs Assessment: post-procedure vital signs reviewed and stable Respiratory status: spontaneous breathing, nonlabored ventilation and respiratory function stable Cardiovascular status: blood pressure returned to baseline and stable Postop Assessment: no apparent nausea or vomiting Anesthetic complications: no    Last Vitals:  Vitals:   09/13/18 1145 09/13/18 1200  BP: 113/71 111/69  Pulse: 72 81  Resp: 15 17  Temp:    SpO2: 100% 95%    Last Pain:  Vitals:   09/13/18 1200  TempSrc:   PainSc: 0-No pain                 Lowella Curb

## 2018-09-13 NOTE — Progress Notes (Addendum)
Assisted Dr. Acey Lav with right ultrasound guided, supraclavicular block. Side rails up, monitors on throughout procedure. See vital signs in flow sheet. Tolerated Procedure well.

## 2018-09-13 NOTE — Op Note (Addendum)
   Date of Surgery: 09/13/2018  INDICATIONS: The patient is a 53 year old female with right shoulder adhesive capsulitis that has failed conservative treatment;  The patient did consent to the procedure after discussion of the risks and benefits.  PREOPERATIVE DIAGNOSIS: Right shoulder adhesive capsulitis  POSTOPERATIVE DIAGNOSIS: Same.  PROCEDURE:  1.  Manipulation of right shoulder joint under general anesthesia 2.  Right shoulder arthroscopic lysis of adhesions and extensive debridement of rotator cuff, labrum, biceps, humeral head 3.  Right shoulder arthroscopic acromioplasty and subacromial decompression  SURGEON: N. Glee Arvin, M.D.  ASSIST: Starlyn Skeans [place], [place]; necessary for the timely completion of procedure and due to complexity of procedure..  ANESTHESIA:  general, regional  IV FLUIDS AND URINE: See anesthesia.  ESTIMATED BLOOD LOSS: minimal mL.  IMPLANTS: none  COMPLICATIONS: None.  DESCRIPTION OF PROCEDURE: The patient was brought to the operating room and placed supine on the operating table.  The patient had been signed prior to the procedure and this was documented. The patient had the anesthesia placed by the anesthesiologist.  A time-out was performed to confirm that this was the correct patient, site, side and location. The patient did receive antibiotics prior to the incision and was re-dosed during the procedure as needed at indicated intervals.  The patient was then moved into the lateral position with the operative extremity suspended in the fishing pole mechanism. The patient had the operative extremity prepped and draped in the standard surgical fashion.   We first began with the manipulation.  The shoulder was gently manipulated first in the forward flexion plane and I was able to improve her range of motion from approximately 100 degrees to 175 degrees, I then externally rotated her to only about 20 degrees.  Internal rotation was to the waist.  I then made  incisions for arthroscopy portals.  Once the portals were established I was able to visualize significant synovitis throughout the shoulder joint.  The rotator interval was densely scarred.  I used a cautery wand in order to release the rotator interval until I visualized the muscle layer.  The subscapularis tendon was gently debrided.  The labrum was also gently debrided.  The biceps tendon was also gently debrided.  The articular surface of the rotator cuff was intact and demonstrated good healing from the previous repair.  After this I repositioned the arthroscope into the subacromial space.  Again there was extensive adhesions and inflammation and bursitis within the subacromial and subdeltoid bursa.  A subacromial decompression was then performed using a cautery wand and shaver.  The bursal surface of the rotator cuff was also intact and the previous repair had healed well.  I gently debrided mild tendinosis.  After this was done I then performed another manipulation of the shoulder and I was able to improve her external rotation to 85 degrees.  Surgical incisions were closed with interrupted nylon sutures.  Sterile dressings were applied.  Shoulder sling was placed.  Patient tolerated the procedure well had no immediate complications.  POSTOPERATIVE PLAN: Patient will discharge home and begin physical therapy tomorrow.  Mayra Reel, MD [facility] [phone] 10:34 AM

## 2018-09-13 NOTE — Anesthesia Procedure Notes (Signed)
Procedure Name: Intubation Date/Time: 09/13/2018 9:37 AM Performed by: Marrianne Mood, CRNA Pre-anesthesia Checklist: Patient identified, Emergency Drugs available, Suction available, Patient being monitored and Timeout performed Patient Re-evaluated:Patient Re-evaluated prior to induction Oxygen Delivery Method: Circle system utilized Preoxygenation: Pre-oxygenation with 100% oxygen Induction Type: IV induction Ventilation: Mask ventilation without difficulty Laryngoscope Size: Mac and 3 Grade View: Grade II Tube type: Oral Tube size: 7.0 mm Number of attempts: 1 Airway Equipment and Method: Stylet and Oral airway Placement Confirmation: ETT inserted through vocal cords under direct vision,  positive ETCO2 and breath sounds checked- equal and bilateral Secured at: 20 cm Tube secured with: Tape Dental Injury: Teeth and Oropharynx as per pre-operative assessment

## 2018-09-13 NOTE — H&P (Signed)
PREOPERATIVE H&P  Chief Complaint: adhesive capsulitis right shoulder  HPI: Leah Khan is a 53 y.o. female who presents for surgical treatment of adhesive capsulitis right shoulder.  She denies any changes in medical history.  Past Medical History:  Diagnosis Date  . Adhesive capsulitis of right shoulder   . Angioedema 04/30/2015  . Hypertension   . Migraine   . Rotator cuff disorder, right    Past Surgical History:  Procedure Laterality Date  . ABDOMINAL HYSTERECTOMY    . BUNIONECTOMY    . SHOULDER ARTHROSCOPY WITH ROTATOR CUFF REPAIR AND SUBACROMIAL DECOMPRESSION Right 03/15/2018   Procedure: RIGHT SHOULDER ARTHROSCOPY WITH EXTENSIVE DEBRIDEMENT, ACROMIOPLASTY, ROTATOR CUFF REPAIR;  Surgeon: Tarry Kos, MD;  Location: [facility];  Service: Orthopedics;  Laterality: Right;  . TONSILLECTOMY     Social History   Socioeconomic History  . Marital status: Married    Spouse name: Not on file  . Number of children: Not on file  . Years of education: Not on file  . Highest education level: Not on file  Occupational History  . Not on file  Social Needs  . Financial resource strain: Not on file  . Food insecurity:    Worry: Not on file    Inability: Not on file  . Transportation needs:    Medical: Not on file    Non-medical: Not on file  Tobacco Use  . Smoking status: Never Smoker  . Smokeless tobacco: Never Used  Substance and Sexual Activity  . Alcohol use: No    Alcohol/week: 0.0 standard drinks  . Drug use: No  . Sexual activity: Not on file  Lifestyle  . Physical activity:    Days per week: Not on file    Minutes per session: Not on file  . Stress: Not on file  Relationships  . Social connections:    Talks on phone: Not on file    Gets together: Not on file    Attends religious service: Not on file    Active member of club or organization: Not on file    Attends meetings of clubs or organizations: Not on file    Relationship status:  Not on file  Other Topics Concern  . Not on file  Social History Narrative  . Not on file   Family History  Problem Relation Age of Onset  . Alcoholism Father   . Diabetes Maternal Grandmother    Allergies  Allergen Reactions  . Ace Inhibitors Anaphylaxis    angioedema  . Amitriptyline     Irritability   . Sertraline Hcl     Zombie feeling   . Tape    Prior to Admission medications   Medication Sig Start Date End Date Taking? Authorizing Provider  amLODipine (NORVASC) 5 MG tablet Take 5 mg by mouth daily. 04/24/15  Yes [provider]  ibuprofen (ADVIL,MOTRIN) 800 MG tablet Take 1 tablet (800 mg total) by mouth every 8 (eight) hours as needed. 07/26/18  Yes Tarry Kos, MD  Multiple Vitamin (MULTIVITAMIN) tablet Take 1 tablet by mouth daily.   Yes [provider]  topiramate (TOPAMAX) 100 MG tablet Take 100 mg by mouth daily. 02/01/15  Yes [provider]  zolpidem (AMBIEN) 5 MG tablet Take 1-2 tablets (5-10 mg total) by mouth at bedtime as needed for sleep. 04/26/18  Yes Tarry Kos, MD     Positive ROS: All other systems have been reviewed and were otherwise negative with  the exception of those mentioned in the HPI and as above.  Physical Exam: General: Alert, no acute distress Cardiovascular: No pedal edema Respiratory: No cyanosis, no use of accessory musculature GI: abdomen soft Skin: No lesions in the area of chief complaint Neurologic: Sensation intact distally Psychiatric: Patient is competent for consent with normal mood and affect Lymphatic: no lymphedema  MUSCULOSKELETAL: exam stable  Assessment: adhesive capsulitis right shoulder  Plan: Plan for Procedure(s): RIGHT SHOULDER ARTHROSCOPY WITH LYSIS OF ADHESIONS, SUBACROMIAL DECOMPRESSION AND MANIPULATION UNDER ANESTHESIA  The risks benefits and alternatives were discussed with the patient including but not limited to the risks of nonoperative treatment, versus surgical  intervention including infection, bleeding, nerve injury,  blood clots, cardiopulmonary complications, morbidity, mortality, among others, and they were willing to proceed.   Glee Arvin, MD   09/13/2018 7:51 AM

## 2018-09-13 NOTE — Anesthesia Procedure Notes (Signed)
Anesthesia Regional Block: Supraclavicular block   Pre-Anesthetic Checklist: ,, timeout performed, Correct Patient, Correct Site, Correct Laterality, Correct Procedure, Correct Position, site marked, Risks and benefits discussed,  Surgical consent,  Pre-op evaluation,  At surgeon's request and post-op pain management  Laterality: Right and Upper  Prep: Maximum Sterile Barrier Precautions used, chloraprep       Needles:  Injection technique: Single-shot  Needle Type: Echogenic Stimulator Needle     Needle Length: 10cm      Additional Needles:   Procedures:,,,, ultrasound used (permanent image in chart),,,,  Narrative:  Start time: 09/13/2018 8:57 AM End time: 09/13/2018 9:07 AM Injection made incrementally with aspirations every 5 mL.  Performed by: Personally  Anesthesiologist: Phillips Grout, MD  Additional Notes: Risks, benefits and alternative to block explained extensively.  Patient tolerated procedure well, without complications.

## 2018-09-14 ENCOUNTER — Telehealth (INDEPENDENT_AMBULATORY_CARE_PROVIDER_SITE_OTHER): Payer: Self-pay | Admitting: Orthopaedic Surgery

## 2018-09-14 ENCOUNTER — Encounter (HOSPITAL_BASED_OUTPATIENT_CLINIC_OR_DEPARTMENT_OTHER): Payer: Self-pay | Admitting: Orthopaedic Surgery

## 2018-09-14 NOTE — Telephone Encounter (Signed)
Per Roda Shutters we will send her to [facility] On [address]. Referral faxed.

## 2018-09-15 ENCOUNTER — Other Ambulatory Visit: Payer: Self-pay

## 2018-09-15 ENCOUNTER — Ambulatory Visit: Payer: BLUE CROSS/BLUE SHIELD | Attending: Orthopaedic Surgery | Admitting: Physical Therapy

## 2018-09-15 ENCOUNTER — Encounter: Payer: Self-pay | Admitting: Physical Therapy

## 2018-09-15 DIAGNOSIS — M25511 Pain in right shoulder: Secondary | ICD-10-CM | POA: Diagnosis present

## 2018-09-15 DIAGNOSIS — M6281 Muscle weakness (generalized): Secondary | ICD-10-CM | POA: Diagnosis present

## 2018-09-15 DIAGNOSIS — M25611 Stiffness of right shoulder, not elsewhere classified: Secondary | ICD-10-CM | POA: Insufficient documentation

## 2018-09-15 NOTE — Therapy (Signed)
[facility] [address] [place], [place], [address] Phone: [phone]   Fax:  [phone]  Physical Therapy Evaluation  Patient Details  Name: Leah Khan MRN: [id number] Date of Birth: [birth year] Referring Provider (PT): Dr Gershon Mussel   Encounter Date: 09/15/2018  PT End of Session - 09/15/18 0840    Visit Number  1    Number of Visits  18    Date for PT Re-Evaluation  10/27/18    Authorization Type  [employer]    PT Start Time  0840    PT Stop Time  0931    PT Time Calculation (min)  51 min    Activity Tolerance  Patient tolerated treatment well       Past Medical History:  Diagnosis Date  . Adhesive capsulitis of right shoulder   . Angioedema 04/30/2015  . Hypertension   . Migraine   . Rotator cuff disorder, right     Past Surgical History:  Procedure Laterality Date  . ABDOMINAL HYSTERECTOMY    . BUNIONECTOMY    . SHOULDER ARTHROSCOPY WITH ROTATOR CUFF REPAIR AND SUBACROMIAL DECOMPRESSION Right 03/15/2018   Procedure: RIGHT SHOULDER ARTHROSCOPY WITH EXTENSIVE DEBRIDEMENT, ACROMIOPLASTY, ROTATOR CUFF REPAIR;  Surgeon: Tarry Kos, MD;  Location: [facility];  Service: Orthopedics;  Laterality: Right;  . SHOULDER ARTHROSCOPY WITH SUBACROMIAL DECOMPRESSION Right 09/13/2018   Procedure: RIGHT SHOULDER ARTHROSCOPY WITH LYSIS OF ADHESIONS, SUBACROMIAL DECOMPRESSION AND MANIPULATION UNDER ANESTHESIA;  Surgeon: Tarry Kos, MD;  Location: [facility];  Service: Orthopedics;  Laterality: Right;  . TONSILLECTOMY      There were no vitals filed for this visit.   Subjective Assessment - 09/15/18 0841    Subjective  Pt had manipulation of Rt shoulder and cleaned up scar tissue yesterday,  had RTC surgery 5 months ago and was having PT and the shoulder froze up.  Referred to PT here now.     Patient is accompained by:  Family member   husband   Patient Stated Goals  get her ROM to almost normal  and reduce pain    Currently in Pain?  Yes    Pain Score  8     Pain Location  Shoulder    Pain Orientation  Right    Pain Descriptors / Indicators  Aching;Sharp;Shooting    Pain Type  Surgical pain    Pain Onset  More than a month ago    Pain Frequency  Constant    Aggravating Factors   movement    Pain Relieving Factors  meds ICE         [facility] PT Assessment - 09/15/18 0001      Assessment   Medical Diagnosis  Rt shoulder adhesive capulitis, s/p manipulation    Referring Provider (PT)  Dr Gershon Mussel    Onset Date/Surgical Date  09/13/18    Hand Dominance  Right    Next MD Visit  09/20/2018    Prior Therapy  yes      Precautions   Precautions  None      Balance Screen   Has the patient fallen in the past 6 months  No      Prior Function   Level of Independence  Needs assistance with homemaking;Needs assistance with ADLs    Vocation  Retired    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      Observation/Other Assessments   Focus on Therapeutic Outcomes (FOTO)  60% limited      Posture/Postural Control   Posture/Postural Control  No significant limitations      ROM / Strength   AROM / PROM / Strength  PROM;AROM;Strength      AROM   Overall AROM Comments  elbow/wrist/hands WNL, Lt shoulder WNL    AROM Assessment Site  Shoulder;Cervical    Right/Left Shoulder  Right    Right Shoulder Extension  11 Degrees    Right Shoulder Flexion  13 Degrees    Right Shoulder ABduction  16 Degrees    Right Shoulder Internal Rotation  --   on to belly   Right Shoulder External Rotation  -16 Degrees   from neutral   Cervical Flexion  22 with pain    Cervical Extension  45 with pain    Cervical - Right Rotation  39    Cervical - Left Rotation  68      PROM   PROM Assessment Site  Shoulder    Right/Left Shoulder  Right    Right Shoulder Flexion  80 Degrees    Right Shoulder ABduction  50 Degrees    Right Shoulder Internal Rotation  60 Degrees    Right Shoulder External  Rotation  12 Degrees      Strength   Overall Strength Comments  NA at this time      Palpation   Palpation comment  tender around Rt shoulder - she had post op dressing on so not formally assessed.                 Objective measurements completed on examination: See above findings.      [facility] Adult PT Treatment/Exercise - 09/15/18 0001      Self-Care   Self-Care  Other Self-Care Comments    Other Self-Care Comments   instructed husband in PROM to Rt shoulder flex/abduct/ER and gentle long arm traction all in supine - he return demo'd       Exercises   Exercises  Shoulder   performed per HEP with VC for form and encouragement     Modalities   Modalities  --   she is going to ice at home.            PT Education - 09/15/18 1209    Education Details  HEP , PROM for her spouse to perform     Person(s) Educated  Patient;Spouse    Methods  Explanation;Demonstration;Tactile cues;Verbal cues;Handout    Comprehension  Verbal cues required;Returned demonstration;Verbalized understanding          PT Long Term Goals - 09/15/18 1215      PT LONG TERM GOAL #1   Title  I with advanced HEP ( 10/27/2018)     Time  6    Period  Weeks    Status  New    Target Date  10/27/18      PT LONG TERM GOAL #2   Title  demo Rt shoulder ROM within 10 degrees of Lt ( 10/27/2018)     Time  6    Period  Weeks    Status  New    Target Date  10/27/18      PT LONG TERM GOAL #3   Title  Rt UE strength =/> 4+/5 to allow her to return to normal activity ( 10/27/2018)     Time  6    Period  Weeks    Status  New    Target Date  10/27/18  PT LONG TERM GOAL #4   Title  report no more than 2/10 Rt shoulder pain when performing house hold acitivities (10/27/2018)     Time  6    Period  Weeks    Status  New    Target Date  10/27/18      PT LONG TERM GOAL #5   Title  improve FOTO =/< 39% limited ( 10/27/2018)     Time  6    Period  Weeks    Status  New    Target Date   10/27/18             Plan - 09/15/18 1212    Clinical Impression Statement  53 yo female ~ 5 months s/p Rt RTC repair with resultant adhesive capsulitis.  She returned to surgery yesterday for a manipulation and clean out of scar tissue.  Presents today to restart therapy to restore ROM, strenght and use of her Rt arm.  she has limited ROM and strength a long with pain.  Her husband is very supportive and able to assist .     Personal Factors and Comorbidities  Time since onset of injury/illness/exacerbation    Examination-Activity Limitations  Bathing;Hygiene/Grooming;Reach Overhead;Carry;Self Feeding;Dressing;Sleep    Examination-Participation Restrictions  Other;Meal Prep;Shop    Stability/Clinical Decision Making  Stable/Uncomplicated    Clinical Decision Making  Low    Rehab Potential  Good    PT Frequency  3x / week    PT Duration  6 weeks    PT Treatment/Interventions  Passive range of motion;Neuromuscular re-education;Moist Heat;Ultrasound;Manual techniques;Dry needling;Joint Manipulations;Patient/family education;Cryotherapy;Electrical Stimulation;Therapeutic exercise;Taping;Vasopneumatic Device    PT Next Visit Plan  PROM and AAROM Rt shoulder, scap stability and cervical motion, modalities PRN     Consulted and Agree with Plan of Care  Patient;Family member/caregiver    Family Member Consulted  husband       Patient will benefit from skilled therapeutic intervention in order to improve the following deficits and impairments:  Pain, Increased muscle spasms, Decreased range of motion, Decreased strength, Impaired UE functional use  Visit Diagnosis: Stiffness of right shoulder, not elsewhere classified - Plan: PT plan of care cert/re-cert  Acute pain of right shoulder - Plan: PT plan of care cert/re-cert  Muscle weakness (generalized) - Plan: PT plan of care cert/re-cert     Problem List Patient Active Problem List   Diagnosis Date Noted  . Adhesive capsulitis of  right shoulder 04/26/2018  . Impingement syndrome of right shoulder   . Tear of right supraspinatus tendon 01/31/2018  . Chronic right shoulder pain 10/18/2016  . Angioedema 04/30/2015  . Allergic rhinitis 04/30/2015    Rose Fillers rPT 09/15/2018, 12:19 PM  [facility] [address] [place], [place], [address] Phone: [phone]   Fax:  [phone]  Name: Leah Khan MRN: [id number] Date of Birth: August 01, 1965

## 2018-09-15 NOTE — Patient Instructions (Signed)
Access Code: [email or web address]  URL: [email or web address]  Date: 09/15/2018  Prepared by: Roderic Scarce   Exercises  Supine Shoulder Press AAROM in Abduction with Dowel - 15 reps - 4-5x daily  Supine Shoulder Flexion with Dowel - 15 reps - 4-5x daily  Supine Shoulder External Rotation in 45 Degrees Abduction AAROM with Dowel - 15 reps - 4-5x daily  Seated Shoulder Flexion Towel Slide at Table Top - 15 reps - 4-5x daily  Seated Scapular Retraction - 15 reps - 5 hold - 4-5x daily  Seated Cervical Retraction - 15 reps - 5 hold - 4-5x daily  Neck Rotation - 15 reps - 5 hold - 4-5x daily  Shoulder pendulums Heat before ex, ice after All elbow motion

## 2018-09-18 ENCOUNTER — Encounter: Payer: Self-pay | Admitting: Physical Therapy

## 2018-09-18 ENCOUNTER — Ambulatory Visit: Payer: BLUE CROSS/BLUE SHIELD | Admitting: Physical Therapy

## 2018-09-18 DIAGNOSIS — M25611 Stiffness of right shoulder, not elsewhere classified: Secondary | ICD-10-CM | POA: Diagnosis not present

## 2018-09-18 DIAGNOSIS — M25511 Pain in right shoulder: Secondary | ICD-10-CM

## 2018-09-18 DIAGNOSIS — M6281 Muscle weakness (generalized): Secondary | ICD-10-CM

## 2018-09-18 NOTE — Therapy (Signed)
tendon 01/31/2018  . Chronic right shoulder pain 10/18/2016  . Angioedema 04/30/2015  . Allergic rhinitis 04/30/2015    Roderic Scarce PT 09/18/2018, 1:39 PM  [facility] [address] [place], [place], [address] Phone: [phone]   Fax:  [phone]  Name: Leah Khan MRN: [id number] Date of Birth: Apr 18, 1966  [facility] [address] [place], [place], [address] Phone: [phone]   Fax:  [phone]  Physical Therapy Treatment  Patient Details  Name: Leah Khan MRN: [id number] Date of Birth: 01/31/1966 Referring Provider (PT): Dr Gershon Mussel   Encounter Date: 09/18/2018  PT End of Session - 09/18/18 1237    Visit Number  2    Number of Visits  18    Date for PT Re-Evaluation  10/27/18    Authorization Type  BCBS    PT Start Time  1243    PT Stop Time  1330    PT Time Calculation (min)  47 min    Activity Tolerance  Patient tolerated treatment well       Past Medical History:  Diagnosis Date  . Adhesive capsulitis of right shoulder   . Angioedema 04/30/2015  . Hypertension   . Migraine   . Rotator cuff disorder, right     Past Surgical History:  Procedure Laterality Date  . ABDOMINAL HYSTERECTOMY    . BUNIONECTOMY    . SHOULDER ARTHROSCOPY WITH ROTATOR CUFF REPAIR AND SUBACROMIAL DECOMPRESSION Right 03/15/2018   Procedure: RIGHT SHOULDER ARTHROSCOPY WITH EXTENSIVE DEBRIDEMENT, ACROMIOPLASTY, ROTATOR CUFF REPAIR;  Surgeon: Tarry Kos, MD;  Location: [facility];  Service: Orthopedics;  Laterality: Right;  . SHOULDER ARTHROSCOPY WITH SUBACROMIAL DECOMPRESSION Right 09/13/2018   Procedure: RIGHT SHOULDER ARTHROSCOPY WITH LYSIS OF ADHESIONS, SUBACROMIAL DECOMPRESSION AND MANIPULATION UNDER ANESTHESIA;  Surgeon: Tarry Kos, MD;  Location: [facility];  Service: Orthopedics;  Laterality: Right;  . TONSILLECTOMY      There were no vitals filed for this visit.  Subjective Assessment - 09/18/18 1243    Subjective  Pt reports she is doing her HEP, difficult with PROM on table - provided two suggestions to work on it.  Husband doing well with PROM    Patient is accompained by:  Family member   husband   Patient Stated Goals  get her ROM to almost normal and reduce pain    Currently in Pain?  Yes     Pain Score  7     Pain Location  Shoulder    Pain Orientation  Right;Anterior    Pain Descriptors / Indicators  Aching    Pain Type  Surgical pain    Pain Onset  More than a month ago    Pain Frequency  Constant    Aggravating Factors   stays there    Pain Relieving Factors  eases on its own          [facility] PT Assessment - 09/18/18 0001      Assessment   Medical Diagnosis  Rt shoulder adhesive capulitis, s/p manipulation      PROM   Right Shoulder Flexion  118 Degrees    Right Shoulder ABduction  100 Degrees    Right Shoulder External Rotation  38 Degrees                   [facility] Adult PT Treatment/Exercise - 09/18/18 0001      Shoulder Exercises: Supine   Other Supine Exercises  10x10sec shoulder presses with scap retraction      Shoulder Exercises: Prone   Retraction  Strengthening;Right;10 reps   10 sec holds     Shoulder Exercises: Standing   Other Standing Exercises  FWD punch motion with mirror to work on isolating GH movement and not moving or lifting scapula  [facility] [address] [place], [place], [address] Phone: [phone]   Fax:  [phone]  Physical Therapy Treatment  Patient Details  Name: Leah Khan MRN: [id number] Date of Birth: 01/31/1966 Referring Provider (PT): Dr Gershon Mussel   Encounter Date: 09/18/2018  PT End of Session - 09/18/18 1237    Visit Number  2    Number of Visits  18    Date for PT Re-Evaluation  10/27/18    Authorization Type  BCBS    PT Start Time  1243    PT Stop Time  1330    PT Time Calculation (min)  47 min    Activity Tolerance  Patient tolerated treatment well       Past Medical History:  Diagnosis Date  . Adhesive capsulitis of right shoulder   . Angioedema 04/30/2015  . Hypertension   . Migraine   . Rotator cuff disorder, right     Past Surgical History:  Procedure Laterality Date  . ABDOMINAL HYSTERECTOMY    . BUNIONECTOMY    . SHOULDER ARTHROSCOPY WITH ROTATOR CUFF REPAIR AND SUBACROMIAL DECOMPRESSION Right 03/15/2018   Procedure: RIGHT SHOULDER ARTHROSCOPY WITH EXTENSIVE DEBRIDEMENT, ACROMIOPLASTY, ROTATOR CUFF REPAIR;  Surgeon: Tarry Kos, MD;  Location: [facility];  Service: Orthopedics;  Laterality: Right;  . SHOULDER ARTHROSCOPY WITH SUBACROMIAL DECOMPRESSION Right 09/13/2018   Procedure: RIGHT SHOULDER ARTHROSCOPY WITH LYSIS OF ADHESIONS, SUBACROMIAL DECOMPRESSION AND MANIPULATION UNDER ANESTHESIA;  Surgeon: Tarry Kos, MD;  Location: [facility];  Service: Orthopedics;  Laterality: Right;  . TONSILLECTOMY      There were no vitals filed for this visit.  Subjective Assessment - 09/18/18 1243    Subjective  Pt reports she is doing her HEP, difficult with PROM on table - provided two suggestions to work on it.  Husband doing well with PROM    Patient is accompained by:  Family member   husband   Patient Stated Goals  get her ROM to almost normal and reduce pain    Currently in Pain?  Yes     Pain Score  7     Pain Location  Shoulder    Pain Orientation  Right;Anterior    Pain Descriptors / Indicators  Aching    Pain Type  Surgical pain    Pain Onset  More than a month ago    Pain Frequency  Constant    Aggravating Factors   stays there    Pain Relieving Factors  eases on its own          [facility] PT Assessment - 09/18/18 0001      Assessment   Medical Diagnosis  Rt shoulder adhesive capulitis, s/p manipulation      PROM   Right Shoulder Flexion  118 Degrees    Right Shoulder ABduction  100 Degrees    Right Shoulder External Rotation  38 Degrees                   [facility] Adult PT Treatment/Exercise - 09/18/18 0001      Shoulder Exercises: Supine   Other Supine Exercises  10x10sec shoulder presses with scap retraction      Shoulder Exercises: Prone   Retraction  Strengthening;Right;10 reps   10 sec holds     Shoulder Exercises: Standing   Other Standing Exercises  FWD punch motion with mirror to work on isolating GH movement and not moving or lifting scapula

## 2018-09-19 ENCOUNTER — Encounter: Payer: Self-pay | Admitting: Physical Therapy

## 2018-09-19 ENCOUNTER — Ambulatory Visit: Payer: BLUE CROSS/BLUE SHIELD | Admitting: Physical Therapy

## 2018-09-19 DIAGNOSIS — M6281 Muscle weakness (generalized): Secondary | ICD-10-CM

## 2018-09-19 DIAGNOSIS — M25611 Stiffness of right shoulder, not elsewhere classified: Secondary | ICD-10-CM

## 2018-09-19 DIAGNOSIS — M25511 Pain in right shoulder: Secondary | ICD-10-CM

## 2018-09-19 NOTE — Therapy (Signed)
[facility] [address] [place], [place], [address] Phone: [phone]   Fax:  [phone]  Physical Therapy Treatment  Patient Details  Name: Leah Khan MRN: [id number] Date of Birth: [birth year] Referring Provider (PT): Dr Gershon Mussel   Encounter Date: 09/19/2018  PT End of Session - 09/19/18 1230    Visit Number  3    Number of Visits  18    Date for PT Re-Evaluation  10/27/18    Authorization Type  BCBS    PT Start Time  1230    PT Stop Time  1316    PT Time Calculation (min)  46 min    Activity Tolerance  Patient tolerated treatment well       Past Medical History:  Diagnosis Date  . Adhesive capsulitis of right shoulder   . Angioedema 04/30/2015  . Hypertension   . Migraine   . Rotator cuff disorder, right     Past Surgical History:  Procedure Laterality Date  . ABDOMINAL HYSTERECTOMY    . BUNIONECTOMY    . SHOULDER ARTHROSCOPY WITH ROTATOR CUFF REPAIR AND SUBACROMIAL DECOMPRESSION Right 03/15/2018   Procedure: RIGHT SHOULDER ARTHROSCOPY WITH EXTENSIVE DEBRIDEMENT, ACROMIOPLASTY, ROTATOR CUFF REPAIR;  Surgeon: Tarry Kos, MD;  Location: [facility];  Service: Orthopedics;  Laterality: Right;  . SHOULDER ARTHROSCOPY WITH SUBACROMIAL DECOMPRESSION Right 09/13/2018   Procedure: RIGHT SHOULDER ARTHROSCOPY WITH LYSIS OF ADHESIONS, SUBACROMIAL DECOMPRESSION AND MANIPULATION UNDER ANESTHESIA;  Surgeon: Tarry Kos, MD;  Location: [facility];  Service: Orthopedics;  Laterality: Right;  . TONSILLECTOMY      There were no vitals filed for this visit.  Subjective Assessment - 09/19/18 1231    Subjective  Pt reports she has a lot of aching at night and it is really limiting her sleep.  Has tried ice and heat without a lot of relief and 800mg  motrin.       Patient Stated Goals  get her ROM to almost normal and reduce pain    Currently in Pain?  Yes    Pain Score  7     Pain Location   Shoulder    Pain Orientation  Right;Anterior    Pain Descriptors / Indicators  Aching    Pain Type  Surgical pain    Pain Onset  More than a month ago    Pain Frequency  Constant      Rec she try adding tylenol at night to help with pain.    [facility] PT Assessment - 09/19/18 0001      Assessment   Medical Diagnosis  Rt shoulder adhesive capulitis, s/p manipulation    Referring Provider (PT)  Dr Gershon Mussel    Onset Date/Surgical Date  09/13/18      PROM   Right Shoulder Flexion  145 Degrees    Right Shoulder ABduction  105 Degrees    Right Shoulder External Rotation  47 Degrees                   [facility] Adult PT Treatment/Exercise - 09/19/18 0001      Shoulder Exercises: ROM/Strengthening   [place]  Rt UE flexion with verbal and physical cues to have proper scapulothoracic pattern    Other ROM/Strengthening Exercises  wall ladder for flexion       Shoulder Exercises: Isometric Strengthening   External Rotation  --   10 sec with thumb up, palm up, palm down     Modalities  Modalities  Moist Heat      Moist Heat Therapy   Number Minutes Moist Heat  10 Minutes    Moist Heat Location  Shoulder   before and during stretching     Manual Therapy   Manual Therapy  Passive ROM;Joint mobilization    Joint Mobilization  Rt scapular mobs in s/l    Passive ROM  Rt shoulder in all directions, supine                  PT Long Term Goals - 09/15/18 1215      PT LONG TERM GOAL #1   Title  I with advanced HEP ( 10/27/2018)     Time  6    Period  Weeks    Status  New    Target Date  10/27/18      PT LONG TERM GOAL #2   Title  demo Rt shoulder ROM within 10 degrees of Lt ( 10/27/2018)     Time  6    Period  Weeks    Status  New    Target Date  10/27/18      PT LONG TERM GOAL #3   Title  Rt UE strength =/> 4+/5 to allow her to return to normal activity ( 10/27/2018)     Time  6    Period  Weeks    Status  New    Target Date  10/27/18      PT LONG TERM  GOAL #4   Title  report no more than 2/10 Rt shoulder pain when performing house hold acitivities (10/27/2018)     Time  6    Period  Weeks    Status  New    Target Date  10/27/18      PT LONG TERM GOAL #5   Title  improve FOTO =/< 39% limited ( 10/27/2018)     Time  6    Period  Weeks    Status  New    Target Date  10/27/18            Plan - 09/19/18 1322    Clinical Impression Statement  Pt continues to gain ROM, doing well with HEP and working on stabilizing the scapula.  She was able to transition into some AROM today.     Rehab Potential  Good    PT Frequency  3x / week    PT Duration  6 weeks    PT Treatment/Interventions  Passive range of motion;Neuromuscular re-education;Moist Heat;Ultrasound;Manual techniques;Dry needling;Joint Manipulations;Patient/family education;Cryotherapy;Electrical Stimulation;Therapeutic exercise;Taping;Vasopneumatic Device    PT Next Visit Plan  PROM and AAROM Rt shoulder, scap stability and cervical motion, modalities PRN     Consulted and Agree with Plan of Care  Patient;Family member/caregiver    Family Member Consulted  husband       Patient will benefit from skilled therapeutic intervention in order to improve the following deficits and impairments:  Pain, Increased muscle spasms, Decreased range of motion, Decreased strength, Impaired UE functional use  Visit Diagnosis: Stiffness of right shoulder, not elsewhere classified  Acute pain of right shoulder  Muscle weakness (generalized)     Problem List Patient Active Problem List   Diagnosis Date Noted  . Adhesive capsulitis of right shoulder 04/26/2018  . Impingement syndrome of right shoulder   . Tear of right supraspinatus tendon 01/31/2018  . Chronic right shoulder pain 10/18/2016  . Angioedema 04/30/2015  . Allergic rhinitis 04/30/2015    Roderic Scarce  PT  09/19/2018, 1:24 PM  [facility] [address] [place],  [place], [address] Phone: [phone]   Fax:  [phone]  Name: SEASON GOLDTOOTH MRN: [id number] Date of Birth: February 14, 1966

## 2018-09-20 ENCOUNTER — Ambulatory Visit: Payer: BLUE CROSS/BLUE SHIELD | Admitting: Physical Therapy

## 2018-09-20 ENCOUNTER — Other Ambulatory Visit: Payer: Self-pay

## 2018-09-20 ENCOUNTER — Encounter: Payer: Self-pay | Admitting: Physical Therapy

## 2018-09-20 DIAGNOSIS — M25611 Stiffness of right shoulder, not elsewhere classified: Secondary | ICD-10-CM

## 2018-09-20 DIAGNOSIS — M6281 Muscle weakness (generalized): Secondary | ICD-10-CM

## 2018-09-20 DIAGNOSIS — M25511 Pain in right shoulder: Secondary | ICD-10-CM

## 2018-09-20 NOTE — Therapy (Signed)
spasms, Decreased range of motion, Decreased strength, Impaired UE functional use  Visit Diagnosis: Stiffness of right shoulder, not elsewhere classified  Acute pain of right shoulder  Muscle weakness (generalized)     Problem  List Patient Active Problem List   Diagnosis Date Noted  . Adhesive capsulitis of right shoulder 04/26/2018  . Impingement syndrome of right shoulder   . Tear of right supraspinatus tendon 01/31/2018  . Chronic right shoulder pain 10/18/2016  . Angioedema 04/30/2015  . Allergic rhinitis 04/30/2015    Roderic Scarce PT  09/20/2018, 1:24 PM  [facility] [address] [place], [place], [address] Phone: [phone]   Fax:  [phone]  Name: Leah Khan MRN: [id number] Date of Birth: [birth year]  [facility] [address] [place], [place], [address] Phone: [phone]   Fax:  [phone]  Physical Therapy Treatment  Patient Details  Name: Leah Khan MRN: [id number] Date of Birth: July 20, 1965 Referring Provider (PT): Dr Gershon Mussel   Encounter Date: 09/20/2018  PT End of Session - 09/20/18 1232    Visit Number  4    Number of Visits  18    Date for PT Re-Evaluation  10/27/18    Authorization Type  BCBS    PT Start Time  1233    PT Stop Time  1317    PT Time Calculation (min)  44 min    Activity Tolerance  Patient tolerated treatment well       Past Medical History:  Diagnosis Date  . Adhesive capsulitis of right shoulder   . Angioedema 04/30/2015  . Hypertension   . Migraine   . Rotator cuff disorder, right     Past Surgical History:  Procedure Laterality Date  . ABDOMINAL HYSTERECTOMY    . BUNIONECTOMY    . SHOULDER ARTHROSCOPY WITH ROTATOR CUFF REPAIR AND SUBACROMIAL DECOMPRESSION Right 03/15/2018   Procedure: RIGHT SHOULDER ARTHROSCOPY WITH EXTENSIVE DEBRIDEMENT, ACROMIOPLASTY, ROTATOR CUFF REPAIR;  Surgeon: Tarry Kos, MD;  Location: [facility];  Service: Orthopedics;  Laterality: Right;  . SHOULDER ARTHROSCOPY WITH SUBACROMIAL DECOMPRESSION Right 09/13/2018   Procedure: RIGHT SHOULDER ARTHROSCOPY WITH LYSIS OF ADHESIONS, SUBACROMIAL DECOMPRESSION AND MANIPULATION UNDER ANESTHESIA;  Surgeon: Tarry Kos, MD;  Location: [facility];  Service: Orthopedics;  Laterality: Right;  . TONSILLECTOMY      There were no vitals filed for this visit.  Subjective Assessment - 09/20/18 1232    Subjective  Pt used ice/heat and motrin last night , didn't feel like she needed tylenol and didn't cry     Patient Stated Goals  get her ROM to almost normal and reduce pain    Currently in Pain?  Yes    Pain Score  5     Pain Location  Shoulder    Pain Orientation  Right;Anterior    Pain  Descriptors / Indicators  Aching    Pain Type  Surgical pain    Pain Onset  1 to 4 weeks ago    Pain Frequency  Constant    Aggravating Factors   constant , moving    Pain Relieving Factors  meds, heat and ice         [facility] PT Assessment - 09/20/18 0001      Assessment   Medical Diagnosis  Rt shoulder adhesive capulitis, s/p manipulation    Referring Provider (PT)  Dr Gershon Mussel      PROM   Right Shoulder Flexion  148 Degrees    Right Shoulder ABduction  114 Degrees    Right Shoulder External Rotation  41 Degrees                   [facility] Adult PT Treatment/Exercise - 09/20/18 0001      Shoulder Exercises: Supine   Flexion  Right;AROM;10 reps   CW/CCW circles at 90 degrees flex    Other Supine Exercises  10x5sec elbow presses with hands behind head       Shoulder Exercises: Seated   Horizontal ABduction  Strengthening      Shoulder Exercises: Pulleys   Flexion  2 minutes    Scaption  2 minutes      Shoulder Exercises: Stretch  [facility] [address] [place], [place], [address] Phone: [phone]   Fax:  [phone]  Physical Therapy Treatment  Patient Details  Name: Leah Khan MRN: [id number] Date of Birth: July 20, 1965 Referring Provider (PT): Dr Gershon Mussel   Encounter Date: 09/20/2018  PT End of Session - 09/20/18 1232    Visit Number  4    Number of Visits  18    Date for PT Re-Evaluation  10/27/18    Authorization Type  BCBS    PT Start Time  1233    PT Stop Time  1317    PT Time Calculation (min)  44 min    Activity Tolerance  Patient tolerated treatment well       Past Medical History:  Diagnosis Date  . Adhesive capsulitis of right shoulder   . Angioedema 04/30/2015  . Hypertension   . Migraine   . Rotator cuff disorder, right     Past Surgical History:  Procedure Laterality Date  . ABDOMINAL HYSTERECTOMY    . BUNIONECTOMY    . SHOULDER ARTHROSCOPY WITH ROTATOR CUFF REPAIR AND SUBACROMIAL DECOMPRESSION Right 03/15/2018   Procedure: RIGHT SHOULDER ARTHROSCOPY WITH EXTENSIVE DEBRIDEMENT, ACROMIOPLASTY, ROTATOR CUFF REPAIR;  Surgeon: Tarry Kos, MD;  Location: [facility];  Service: Orthopedics;  Laterality: Right;  . SHOULDER ARTHROSCOPY WITH SUBACROMIAL DECOMPRESSION Right 09/13/2018   Procedure: RIGHT SHOULDER ARTHROSCOPY WITH LYSIS OF ADHESIONS, SUBACROMIAL DECOMPRESSION AND MANIPULATION UNDER ANESTHESIA;  Surgeon: Tarry Kos, MD;  Location: [facility];  Service: Orthopedics;  Laterality: Right;  . TONSILLECTOMY      There were no vitals filed for this visit.  Subjective Assessment - 09/20/18 1232    Subjective  Pt used ice/heat and motrin last night , didn't feel like she needed tylenol and didn't cry     Patient Stated Goals  get her ROM to almost normal and reduce pain    Currently in Pain?  Yes    Pain Score  5     Pain Location  Shoulder    Pain Orientation  Right;Anterior    Pain  Descriptors / Indicators  Aching    Pain Type  Surgical pain    Pain Onset  1 to 4 weeks ago    Pain Frequency  Constant    Aggravating Factors   constant , moving    Pain Relieving Factors  meds, heat and ice         [facility] PT Assessment - 09/20/18 0001      Assessment   Medical Diagnosis  Rt shoulder adhesive capulitis, s/p manipulation    Referring Provider (PT)  Dr Gershon Mussel      PROM   Right Shoulder Flexion  148 Degrees    Right Shoulder ABduction  114 Degrees    Right Shoulder External Rotation  41 Degrees                   [facility] Adult PT Treatment/Exercise - 09/20/18 0001      Shoulder Exercises: Supine   Flexion  Right;AROM;10 reps   CW/CCW circles at 90 degrees flex    Other Supine Exercises  10x5sec elbow presses with hands behind head       Shoulder Exercises: Seated   Horizontal ABduction  Strengthening      Shoulder Exercises: Pulleys   Flexion  2 minutes    Scaption  2 minutes      Shoulder Exercises: Stretch

## 2018-09-21 ENCOUNTER — Encounter (INDEPENDENT_AMBULATORY_CARE_PROVIDER_SITE_OTHER): Payer: Self-pay | Admitting: Physician Assistant

## 2018-09-21 ENCOUNTER — Ambulatory Visit (INDEPENDENT_AMBULATORY_CARE_PROVIDER_SITE_OTHER): Payer: BLUE CROSS/BLUE SHIELD | Admitting: Physician Assistant

## 2018-09-21 DIAGNOSIS — Z9889 Other specified postprocedural states: Secondary | ICD-10-CM

## 2018-09-21 DIAGNOSIS — Z4802 Encounter for removal of sutures: Secondary | ICD-10-CM

## 2018-09-21 DIAGNOSIS — M7501 Adhesive capsulitis of right shoulder: Secondary | ICD-10-CM

## 2018-09-21 NOTE — Progress Notes (Signed)
Post-Op Visit Note   Patient: Leah Khan           Date of Birth: Feb 22, 1966           MRN: [id number] Visit Date: 09/21/2018 PCP: Lupita Raider, MD   Assessment & Plan:  Chief Complaint:  Chief Complaint  Patient presents with  . Right Shoulder - Pain   Visit Diagnoses:  1. Adhesive capsulitis of right shoulder     Plan: Patient is a pleasant 53 year old female who presents to our clinic 8 days status post right shoulder manipulation under anesthesia and arthroscopic lysis of adhesions and subacromial decompression, date of surgery 09/13/2018.  She has been doing fairly well.  She has been in physical therapy daily.  She has been taking Motrin as needed for pain.  No fevers or chills.  Examination of her right shoulder reveals well-healed surgical portals with nylon sutures in place.  No evidence of cellulitis or infection.  She is neurovascularly intact distally.  At this point, we will remove the nylon sutures.  She will continue with formal physical therapy as well as a home exercise program.  She will follow-up with [place] in 5 weeks time for recheck.  Follow-Up Instructions: Return in about 5 weeks (around 10/26/2018).   Orders:  No orders of the defined types were placed in this encounter.  No orders of the defined types were placed in this encounter.   Imaging: No new imaging  PMFS History: Patient Active Problem List   Diagnosis Date Noted  . Adhesive capsulitis of right shoulder 04/26/2018  . Impingement syndrome of right shoulder   . Tear of right supraspinatus tendon 01/31/2018  . Chronic right shoulder pain 10/18/2016  . Angioedema 04/30/2015  . Allergic rhinitis 04/30/2015   Past Medical History:  Diagnosis Date  . Adhesive capsulitis of right shoulder   . Angioedema 04/30/2015  . Hypertension   . Migraine   . Rotator cuff disorder, right     Family History  Problem Relation Age of Onset  . Alcoholism Father   . Diabetes Maternal Grandmother      Past Surgical History:  Procedure Laterality Date  . ABDOMINAL HYSTERECTOMY    . BUNIONECTOMY    . SHOULDER ARTHROSCOPY WITH ROTATOR CUFF REPAIR AND SUBACROMIAL DECOMPRESSION Right 03/15/2018   Procedure: RIGHT SHOULDER ARTHROSCOPY WITH EXTENSIVE DEBRIDEMENT, ACROMIOPLASTY, ROTATOR CUFF REPAIR;  Surgeon: Tarry Kos, MD;  Location: [facility];  Service: Orthopedics;  Laterality: Right;  . SHOULDER ARTHROSCOPY WITH SUBACROMIAL DECOMPRESSION Right 09/13/2018   Procedure: RIGHT SHOULDER ARTHROSCOPY WITH LYSIS OF ADHESIONS, SUBACROMIAL DECOMPRESSION AND MANIPULATION UNDER ANESTHESIA;  Surgeon: Tarry Kos, MD;  Location: [facility];  Service: Orthopedics;  Laterality: Right;  . TONSILLECTOMY     Social History   Occupational History  . Not on file  Tobacco Use  . Smoking status: Never Smoker  . Smokeless tobacco: Never Used  Substance and Sexual Activity  . Alcohol use: No    Alcohol/week: 0.0 standard drinks  . Drug use: No  . Sexual activity: Not on file

## 2018-09-22 ENCOUNTER — Other Ambulatory Visit: Payer: Self-pay

## 2018-09-22 ENCOUNTER — Ambulatory Visit: Payer: BLUE CROSS/BLUE SHIELD | Admitting: Physical Therapy

## 2018-09-22 ENCOUNTER — Encounter: Payer: Self-pay | Admitting: Physical Therapy

## 2018-09-22 DIAGNOSIS — M25611 Stiffness of right shoulder, not elsewhere classified: Secondary | ICD-10-CM | POA: Diagnosis not present

## 2018-09-22 DIAGNOSIS — M25511 Pain in right shoulder: Secondary | ICD-10-CM

## 2018-09-22 DIAGNOSIS — M6281 Muscle weakness (generalized): Secondary | ICD-10-CM

## 2018-09-22 NOTE — Therapy (Signed)
[facility] [address] [place], [place], [address] Phone: [phone]   Fax:  [phone]  Physical Therapy Treatment  Patient Details  Name: Leah Khan MRN: [id number] Date of Birth: 30-Apr-1966 Referring Provider (PT): Dr Gershon Mussel   Encounter Date: 09/22/2018  PT End of Session - 09/22/18 1144    Visit Number  5    Number of Visits  18    Date for PT Re-Evaluation  10/27/18    Authorization Type  BC[employer]    PT Start Time  1144    PT Stop Time  1240    PT Time Calculation (min)  56 min    Activity Tolerance  Patient tolerated treatment well       Past Medical History:  Diagnosis Date  . Adhesive capsulitis of right shoulder   . Angioedema 04/30/2015  . Hypertension   . Migraine   . Rotator cuff disorder, right     Past Surgical History:  Procedure Laterality Date  . ABDOMINAL HYSTERECTOMY    . BUNIONECTOMY    . SHOULDER ARTHROSCOPY WITH ROTATOR CUFF REPAIR AND SUBACROMIAL DECOMPRESSION Right 03/15/2018   Procedure: RIGHT SHOULDER ARTHROSCOPY WITH EXTENSIVE DEBRIDEMENT, ACROMIOPLASTY, ROTATOR CUFF REPAIR;  Surgeon: Tarry Kos, MD;  Location:  [facility];  Service: Orthopedics;  Laterality: Right;  . SHOULDER ARTHROSCOPY WITH SUBACROMIAL DECOMPRESSION Right 09/13/2018   Procedure: RIGHT SHOULDER ARTHROSCOPY WITH LYSIS OF ADHESIONS, SUBACROMIAL DECOMPRESSION AND MANIPULATION UNDER ANESTHESIA;  Surgeon: Tarry Kos, MD;  Location:  [facility];  Service: Orthopedics;  Laterality: Right;  . TONSILLECTOMY      There were no vitals filed for this visit.  Subjective Assessment - 09/22/18 1144    Subjective  Pt reports the MD was pleased and it is ok for DN  (Pended)     Patient Stated Goals  get her ROM to almost normal and reduce pain  (Pended)     Currently in Pain?  Yes  (Pended)     Pain Score  5   (Pended)     Pain Location  Shoulder  (Pended)          [facility] PT Assessment -  09/22/18 0001      PROM   Right Shoulder Flexion  150 Degrees    Right Shoulder External Rotation  50 Degrees                   [facility] Adult PT Treatment/Exercise - 09/22/18 0001      Shoulder Exercises: Supine   Other Supine Exercises  10x5sec elbow presses with hands behind head       Shoulder Exercises: ROM/Strengthening   Other ROM/Strengthening Exercises  5x10 sec overhead reach and stretch with pilates ring.       Shoulder Exercises: Isometric Strengthening   External Rotation  5X10"   10 sec holds into pilates ring   Internal Rotation  5X10"   into pilates ring     Modalities   Modalities  Electrical Stimulation;Moist Heat      Moist Heat Therapy   Number Minutes Moist Heat  10 Minutes    Moist Heat Location  Shoulder      Electrical Stimulation   Electrical Stimulation Location  t shoulder    Electrical Stimulation Action  IFC     Electrical Stimulation Parameters  to tolerance    Electrical Stimulation Goals  Tone;Pain      Manual Therapy   Manual Therapy  Soft tissue  mobilization;Passive ROM    Manual therapy comments  skilled palpation and monitoring during DN    Soft tissue mobilization  STM to muscles around Rt shoulder     Myofascial Release  Rt arm pull in supine    Passive ROM  Rt shoulder in all directions, supine       Trigger Point Dry Needling - 09/22/18 0001    Consent Given?  Yes    Education Handout Provided  Yes    Muscles Treated Upper Quadrant  Deltoid;Teres major;Teres minor;Biceps;Subscapularis   all Rt side    Subscapularis Response  Palpable increased muscle length;Twitch response elicited    Deltoid Response  Palpable increased muscle length;Twitch response elicited    Teres major Response  Palpable increased muscle length;Twitch response elicited    Teres minor Response  Palpable increased muscle length;Twitch response elicited    Biceps Response  Palpable increased muscle length;Twitch response elicited                 PT Long Term Goals - 09/15/18 1215      PT LONG TERM GOAL #1   Title  I with advanced HEP ( 10/27/2018)     Time  6    Period  Weeks    Status  New    Target Date  10/27/18      PT LONG TERM GOAL #2   Title  demo Rt shoulder ROM within 10 degrees of Lt ( 10/27/2018)     Time  6    Period  Weeks    Status  New    Target Date  10/27/18      PT LONG TERM GOAL #3   Title  Rt UE strength =/> 4+/5 to allow her to return to normal activity ( 10/27/2018)     Time  6    Period  Weeks    Status  New    Target Date  10/27/18      PT LONG TERM GOAL #4   Title  report no more than 2/10 Rt shoulder pain when performing house hold acitivities (10/27/2018)     Time  6    Period  Weeks    Status  New    Target Date  10/27/18      PT LONG TERM GOAL #5   Title  improve FOTO =/< 39% limited ( 10/27/2018)     Time  6    Period  Weeks    Status  New    Target Date  10/27/18            Plan - 09/22/18 1236    Clinical Impression Statement  Pt tolerated dryneedling well with some improvement in ROM.  She is doing a great job with her HEP.  Needs to continue with ROM work and adding in more scapular stability andRTC strengthening    Rehab Potential  Good    PT Frequency  3x / week    PT Duration  6 weeks    PT Treatment/Interventions  Passive range of motion;Neuromuscular re-education;Moist Heat;Ultrasound;Manual techniques;Dry needling;Joint Manipulations;Patient/family education;Cryotherapy;Electrical Stimulation;Therapeutic exercise;Taping;Vasopneumatic Device    PT Next Visit Plan  PROM and AAROM Rt shoulder, scap stability and cervical motion, modalities PRN     Consulted and Agree with Plan of Care  Patient       Patient will benefit from skilled therapeutic intervention in order to improve the following deficits and impairments:  Pain, Increased muscle spasms, Decreased range of motion, Decreased strength, Impaired  UE functional use  Visit  Diagnosis: Stiffness of right shoulder, not elsewhere classified  Acute pain of right shoulder  Muscle weakness (generalized)     Problem List Patient Active Problem List   Diagnosis Date Noted  . Adhesive capsulitis of right shoulder 04/26/2018  . Impingement syndrome of right shoulder   . Tear of right supraspinatus tendon 01/31/2018  . Chronic right shoulder pain 10/18/2016  . Angioedema 04/30/2015  . Allergic rhinitis 04/30/2015    Roderic Scarce PT  09/22/2018, 12:37 PM  [facility] [address] [place], [place], [address] Phone: [phone]   Fax:  [phone]  Name: Leah Khan MRN: [id number] Date of Birth: Jun 27, 1966

## 2018-09-26 ENCOUNTER — Other Ambulatory Visit: Payer: Self-pay

## 2018-09-26 ENCOUNTER — Encounter: Payer: Self-pay | Admitting: Physical Therapy

## 2018-09-26 ENCOUNTER — Ambulatory Visit: Payer: BLUE CROSS/BLUE SHIELD | Admitting: Physical Therapy

## 2018-09-26 DIAGNOSIS — M25511 Pain in right shoulder: Secondary | ICD-10-CM

## 2018-09-26 DIAGNOSIS — M6281 Muscle weakness (generalized): Secondary | ICD-10-CM

## 2018-09-26 DIAGNOSIS — M25611 Stiffness of right shoulder, not elsewhere classified: Secondary | ICD-10-CM | POA: Diagnosis not present

## 2018-09-26 NOTE — Therapy (Signed)
[facility] [address] [place], [place], [address] Phone: [phone]   Fax:  [phone]  Physical Therapy Treatment  Patient Details  Name: Leah Khan MRN: [id number] Date of Birth: [birth year] Referring Provider (PT): Dr. Roda Shutters   Encounter Date: 09/26/2018  PT End of Session - 09/26/18 1227    Visit Number  6    Number of Visits  18    Date for PT Re-Evaluation  10/27/18    Authorization Type  BCBS    PT Start Time  1100    PT Stop Time  1145    PT Time Calculation (min)  45 min    Activity Tolerance  Patient tolerated treatment well       Past Medical History:  Diagnosis Date  . Adhesive capsulitis of right shoulder   . Angioedema 04/30/2015  . Hypertension   . Migraine   . Rotator cuff disorder, right     Past Surgical History:  Procedure Laterality Date  . ABDOMINAL HYSTERECTOMY    . BUNIONECTOMY    . SHOULDER ARTHROSCOPY WITH ROTATOR CUFF REPAIR AND SUBACROMIAL DECOMPRESSION Right 03/15/2018   Procedure: RIGHT SHOULDER ARTHROSCOPY WITH EXTENSIVE DEBRIDEMENT, ACROMIOPLASTY, ROTATOR CUFF REPAIR;  Surgeon: Tarry Kos, MD;  Location: [facility];  Service: Orthopedics;  Laterality: Right;  . SHOULDER ARTHROSCOPY WITH SUBACROMIAL DECOMPRESSION Right 09/13/2018   Procedure: RIGHT SHOULDER ARTHROSCOPY WITH LYSIS OF ADHESIONS, SUBACROMIAL DECOMPRESSION AND MANIPULATION UNDER ANESTHESIA;  Surgeon: Tarry Kos, MD;  Location: [facility];  Service: Orthopedics;  Laterality: Right;  . TONSILLECTOMY      There were no vitals filed for this visit.  Subjective Assessment - 09/26/18 1109    Subjective  Pt arriving to therpay reporting 3/10 pain in R shoulder. Pt reporting after the dry needling pt  with increased fatigue in her R arm and had some diffictulty reaching upward, but pt did report less "catching" in her shoulder when lowering her arm.     Patient Stated Goals  get her ROM to almost  normal and reduce pain    Currently in Pain?  Yes    Pain Score  3     Pain Location  Shoulder    Pain Orientation  Right;Anterior    Pain Descriptors / Indicators  Aching    Pain Type  Surgical pain    Pain Onset  1 to 4 weeks ago    Aggravating Factors   constant, moving    Pain Relieving Factors  meds, heat,  and ice         [facility] PT Assessment - 09/26/18 0001      Assessment   Medical Diagnosis  R shoulder adhesive capulitis/ shoulder manipulation    Referring Provider (PT)  Dr. Roda Shutters      PROM   Right Shoulder Flexion  150 Degrees    Right Shoulder External Rotation  45 Degrees                   [facility] Adult PT Treatment/Exercise - 09/26/18 0001      Shoulder Exercises: Supine   External Rotation  AAROM;Right;10 reps;Limitations    External Rotation Limitations  2 sets    Flexion  AAROM;Both;10 reps    Flexion Limitations  2 sets    Other Supine Exercises  10x5sec elbow presses with hands behind head       Shoulder Exercises: Isometric Strengthening   Flexion  5X10"    Extension  therapeutic intervention in  order to improve the following deficits and impairments:  Pain, Increased muscle spasms, Decreased range of motion, Decreased strength, Impaired UE functional use  Visit Diagnosis: Stiffness of right shoulder, not elsewhere classified  Acute pain of right shoulder  Muscle weakness (generalized)     Problem List Patient Active Problem List   Diagnosis Date Noted  . Adhesive capsulitis of right shoulder 04/26/2018  . Impingement syndrome of right shoulder   . Tear of right supraspinatus tendon 01/31/2018  . Chronic right shoulder pain 10/18/2016  . Angioedema 04/30/2015  . Allergic rhinitis 04/30/2015    Sharmon Leyden, PT 09/26/2018, 12:29 PM  [facility] [address] [place], [place], [address] Phone: [phone]   Fax:  [phone]  Name: Leah Khan MRN: [id number] Date of Birth: 1965/10/16  [facility] [address] [place], [place], [address] Phone: [phone]   Fax:  [phone]  Physical Therapy Treatment  Patient Details  Name: Leah Khan MRN: [id number] Date of Birth: [birth year] Referring Provider (PT): Dr. Roda Shutters   Encounter Date: 09/26/2018  PT End of Session - 09/26/18 1227    Visit Number  6    Number of Visits  18    Date for PT Re-Evaluation  10/27/18    Authorization Type  BCBS    PT Start Time  1100    PT Stop Time  1145    PT Time Calculation (min)  45 min    Activity Tolerance  Patient tolerated treatment well       Past Medical History:  Diagnosis Date  . Adhesive capsulitis of right shoulder   . Angioedema 04/30/2015  . Hypertension   . Migraine   . Rotator cuff disorder, right     Past Surgical History:  Procedure Laterality Date  . ABDOMINAL HYSTERECTOMY    . BUNIONECTOMY    . SHOULDER ARTHROSCOPY WITH ROTATOR CUFF REPAIR AND SUBACROMIAL DECOMPRESSION Right 03/15/2018   Procedure: RIGHT SHOULDER ARTHROSCOPY WITH EXTENSIVE DEBRIDEMENT, ACROMIOPLASTY, ROTATOR CUFF REPAIR;  Surgeon: Tarry Kos, MD;  Location: [facility];  Service: Orthopedics;  Laterality: Right;  . SHOULDER ARTHROSCOPY WITH SUBACROMIAL DECOMPRESSION Right 09/13/2018   Procedure: RIGHT SHOULDER ARTHROSCOPY WITH LYSIS OF ADHESIONS, SUBACROMIAL DECOMPRESSION AND MANIPULATION UNDER ANESTHESIA;  Surgeon: Tarry Kos, MD;  Location: [facility];  Service: Orthopedics;  Laterality: Right;  . TONSILLECTOMY      There were no vitals filed for this visit.  Subjective Assessment - 09/26/18 1109    Subjective  Pt arriving to therpay reporting 3/10 pain in R shoulder. Pt reporting after the dry needling pt  with increased fatigue in her R arm and had some diffictulty reaching upward, but pt did report less "catching" in her shoulder when lowering her arm.     Patient Stated Goals  get her ROM to almost  normal and reduce pain    Currently in Pain?  Yes    Pain Score  3     Pain Location  Shoulder    Pain Orientation  Right;Anterior    Pain Descriptors / Indicators  Aching    Pain Type  Surgical pain    Pain Onset  1 to 4 weeks ago    Aggravating Factors   constant, moving    Pain Relieving Factors  meds, heat,  and ice         [facility] PT Assessment - 09/26/18 0001      Assessment   Medical Diagnosis  R shoulder adhesive capulitis/ shoulder manipulation    Referring Provider (PT)  Dr. Roda Shutters      PROM   Right Shoulder Flexion  150 Degrees    Right Shoulder External Rotation  45 Degrees                   [facility] Adult PT Treatment/Exercise - 09/26/18 0001      Shoulder Exercises: Supine   External Rotation  AAROM;Right;10 reps;Limitations    External Rotation Limitations  2 sets    Flexion  AAROM;Both;10 reps    Flexion Limitations  2 sets    Other Supine Exercises  10x5sec elbow presses with hands behind head       Shoulder Exercises: Isometric Strengthening   Flexion  5X10"    Extension

## 2018-09-27 ENCOUNTER — Encounter: Payer: Self-pay | Admitting: Physical Therapy

## 2018-09-27 ENCOUNTER — Ambulatory Visit: Payer: BLUE CROSS/BLUE SHIELD | Admitting: Physical Therapy

## 2018-09-27 DIAGNOSIS — M25611 Stiffness of right shoulder, not elsewhere classified: Secondary | ICD-10-CM

## 2018-09-27 DIAGNOSIS — M25511 Pain in right shoulder: Secondary | ICD-10-CM

## 2018-09-27 DIAGNOSIS — M6281 Muscle weakness (generalized): Secondary | ICD-10-CM

## 2018-09-27 NOTE — Therapy (Signed)
pain of right shoulder  Muscle weakness (generalized)     Problem List Patient Active Problem List   Diagnosis Date Noted  . Adhesive capsulitis of right shoulder 04/26/2018  . Impingement syndrome of right shoulder   . Tear of right supraspinatus tendon 01/31/2018  . Chronic right shoulder pain 10/18/2016  . Angioedema 04/30/2015  . Allergic rhinitis 04/30/2015    Sharmon Leyden, PT 09/27/2018, 11:42 AM  [facility] [address] [place], [place], [address] Phone: [phone]   Fax:  [phone]  Name: Leah Khan MRN: [id number] Date of Birth: Aug 19, 1965  [facility] [address] [place], [place], [address] Phone: [phone]   Fax:  [phone]  Physical Therapy Treatment  Patient Details  Name: Leah Khan MRN: [id number] Date of Birth: May 17, 1966 Referring Provider (PT): Dr. Roda Shutters   Encounter Date: 09/27/2018  PT End of Session - 09/27/18 1135    Visit Number  7    Number of Visits  18    Date for PT Re-Evaluation  10/27/18    Authorization Type  BCBS    PT Start Time  1100    PT Stop Time  1150    PT Time Calculation (min)  50 min    Activity Tolerance  Patient tolerated treatment well    Behavior During Therapy  [facility] for tasks assessed/performed       Past Medical History:  Diagnosis Date  . Adhesive capsulitis of right shoulder   . Angioedema 04/30/2015  . Hypertension   . Migraine   . Rotator cuff disorder, right     Past Surgical History:  Procedure Laterality Date  . ABDOMINAL HYSTERECTOMY    . BUNIONECTOMY    . SHOULDER ARTHROSCOPY WITH ROTATOR CUFF REPAIR AND SUBACROMIAL DECOMPRESSION Right 03/15/2018   Procedure: RIGHT SHOULDER ARTHROSCOPY WITH EXTENSIVE DEBRIDEMENT, ACROMIOPLASTY, ROTATOR CUFF REPAIR;  Surgeon: Tarry Kos, MD;  Location: [facility];  Service: Orthopedics;  Laterality: Right;  . SHOULDER ARTHROSCOPY WITH SUBACROMIAL DECOMPRESSION Right 09/13/2018   Procedure: RIGHT SHOULDER ARTHROSCOPY WITH LYSIS OF ADHESIONS, SUBACROMIAL DECOMPRESSION AND MANIPULATION UNDER ANESTHESIA;  Surgeon: Tarry Kos, MD;  Location: [facility];  Service: Orthopedics;  Laterality: Right;  . TONSILLECTOMY      There were no vitals filed for this visit.  Subjective Assessment - 09/27/18 1117    Subjective  Pt arriving to therapy reporting 4/10 pain in R shoulder. Pt reporting catching feeling is much better.     Patient Stated Goals  get her ROM to almost normal and reduce pain    Currently in Pain?  Yes    Pain Score  4     Pain  Location  Shoulder    Pain Orientation  Right;Anterior    Pain Descriptors / Indicators  Aching    Pain Type  Surgical pain    Pain Onset  1 to 4 weeks ago    Pain Frequency  Intermittent                       [facility] Adult PT Treatment/Exercise - 09/27/18 0001      Shoulder Exercises: Supine   External Rotation  AAROM;Right;10 reps;Limitations   2 sets   Flexion  AAROM;Both;10 reps   2 sets   Other Supine Exercises  10x5sec elbow presses with hands behind head       Shoulder Exercises: Seated   Row  Both;10 reps;Limitations    Theraband Level (Shoulder Row)  Level 1 (Yellow)    Other Seated Exercises  table slides flexion and ER      Shoulder Exercises: Pulleys   Flexion  2 minutes    Scaption  2 minutes      Shoulder Exercises: Isometric Strengthening   Flexion  5X10"    Extension  5X10"    External Rotation  5X10"   10 sec holds into pilates ring   Internal Rotation  5X10"   into pilates ring     Shoulder Exercises: Stretch   Other Shoulder Stretches  L Upper Trap  pain of right shoulder  Muscle weakness (generalized)     Problem List Patient Active Problem List   Diagnosis Date Noted  . Adhesive capsulitis of right shoulder 04/26/2018  . Impingement syndrome of right shoulder   . Tear of right supraspinatus tendon 01/31/2018  . Chronic right shoulder pain 10/18/2016  . Angioedema 04/30/2015  . Allergic rhinitis 04/30/2015    Sharmon Leyden, PT 09/27/2018, 11:42 AM  [facility] [address] [place], [place], [address] Phone: [phone]   Fax:  [phone]  Name: Leah Khan MRN: [id number] Date of Birth: Aug 19, 1965

## 2018-09-28 ENCOUNTER — Encounter: Payer: Self-pay | Admitting: Physical Therapy

## 2018-09-28 ENCOUNTER — Other Ambulatory Visit: Payer: Self-pay

## 2018-09-28 ENCOUNTER — Ambulatory Visit: Payer: BLUE CROSS/BLUE SHIELD | Admitting: Physical Therapy

## 2018-09-28 DIAGNOSIS — M25611 Stiffness of right shoulder, not elsewhere classified: Secondary | ICD-10-CM

## 2018-09-28 DIAGNOSIS — M6281 Muscle weakness (generalized): Secondary | ICD-10-CM

## 2018-09-28 DIAGNOSIS — M25511 Pain in right shoulder: Secondary | ICD-10-CM

## 2018-09-28 NOTE — Therapy (Addendum)
[facility] Outpatient Rehabilitation [facility] [address] [place], [place], [address] Phone: [phone]   Fax:  [phone]  Physical Therapy Treatment/Discharge  Patient Details  Name: Leah Khan MRN: [id number] Date of Birth: 11-18-1965 Referring Provider (PT): Dr. Gershon Mussel   Encounter Date: 09/28/2018  PT End of Session - 09/28/18 1122    Visit Number  8    Number of Visits  18    Date for PT Re-Evaluation  10/27/18    Authorization Type  BC[employer]    PT Start Time  1100    PT Stop Time  1150    PT Time Calculation (min)  50 min    Activity Tolerance  Patient tolerated treatment well    Behavior During Therapy  [facility] for tasks assessed/performed       Past Medical History:  Diagnosis Date  . Adhesive capsulitis of right shoulder   . Angioedema 04/30/2015  . Hypertension   . Migraine   . Rotator cuff disorder, right     Past Surgical History:  Procedure Laterality Date  . ABDOMINAL HYSTERECTOMY    . BUNIONECTOMY    . SHOULDER ARTHROSCOPY WITH ROTATOR CUFF REPAIR AND SUBACROMIAL DECOMPRESSION Right 03/15/2018   Procedure: RIGHT SHOULDER ARTHROSCOPY WITH EXTENSIVE DEBRIDEMENT, ACROMIOPLASTY, ROTATOR CUFF REPAIR;  Surgeon: Tarry Kos, MD;  Location: [facility];  Service: Orthopedics;  Laterality: Right;  . SHOULDER ARTHROSCOPY WITH SUBACROMIAL DECOMPRESSION Right 09/13/2018   Procedure: RIGHT SHOULDER ARTHROSCOPY WITH LYSIS OF ADHESIONS, SUBACROMIAL DECOMPRESSION AND MANIPULATION UNDER ANESTHESIA;  Surgeon: Tarry Kos, MD;  Location: [facility];  Service: Orthopedics;  Laterality: Right;  . TONSILLECTOMY      There were no vitals filed for this visit.  Subjective Assessment - 09/28/18 1121    Subjective  Pt only reporting soreness in her R shoulder this morning of 1/10.     Patient Stated Goals  get her ROM to almost normal and reduce pain    Currently in Pain?  Yes    Pain Score  1     Pain Location  Shoulder    Pain Orientation  Right    Pain Descriptors / Indicators  Sore    Pain Type  Surgical pain    Pain Onset  1 to 4 weeks ago    Pain Frequency  Intermittent         [facility] PT Assessment - 09/28/18 0001      Assessment   Medical Diagnosis  R shoulder adhesive capulitis/ shoulder manipulation    Referring Provider (PT)  Dr. Gershon Mussel      PROM   Right Shoulder Flexion  154 Degrees    Right Shoulder External Rotation  60 Degrees                   [facility] Adult PT Treatment/Exercise - 09/28/18 0001      Shoulder Exercises: Supine   External Rotation  AAROM;Right;10 reps;Limitations   2 sets   Flexion  AAROM;Both;10 reps   2 sets   Diagonals  AAROM;15 reps    Diagonals Limitations  using pilates ring    Other Supine Exercises  10x5sec elbow presses with hands behind head       Shoulder Exercises: Seated   Row  Both;10 reps;Limitations    Theraband Level (Shoulder Row)  Level 2 (Red)    Other Seated Exercises  table slides ER x 5 holding 20 seconds each      Shoulder Exercises:  Standing   External Rotation  Right;15 reps;Theraband    Theraband Level (Shoulder External Rotation)  Level 2 (Red)    Internal Rotation  Right;15 reps;Theraband    Theraband Level (Shoulder Internal Rotation)  Level 2 (Red)    Row  Both;15 reps;Theraband    Theraband Level (Shoulder Row)  Level 2 (Red)      Shoulder Exercises: Pulleys   Flexion  2 minutes    Scaption  2 minutes      Modalities   Modalities  Moist Heat      Moist Heat Therapy   Number Minutes Moist Heat  10 Minutes    Moist Heat Location  Shoulder      Manual Therapy   Manual Therapy  Myofascial release;Passive ROM    Manual therapy comments     Myofascial Release  Rt arm pull in supine    Passive ROM  Rt shoulder in all directions, supine                  PT Long Term Goals - 09/28/18 1201      PT LONG TERM GOAL #1   Title  I with advanced HEP ( 10/27/2018)     Time  6    Period  Weeks     Status  On-going      PT LONG TERM GOAL #2   Title  demo Rt shoulder ROM within 10 degrees of Lt ( 10/27/2018)     Time  6    Period  Weeks    Status  On-going      PT LONG TERM GOAL #3   Title  Rt UE strength =/> 4+/5 to allow her to return to normal activity ( 10/27/2018)     Time  6    Period  Weeks    Status  On-going      PT LONG TERM GOAL #4   Title  report no more than 2/10 Rt shoulder pain when performing house hold acitivities (10/27/2018)     Status  On-going      PT LONG TERM GOAL #5   Title  improve FOTO =/< 39% limited ( 10/27/2018)     Status  Unable to assess            Plan - 09/28/18 1126    Clinical Impression Statement  Pt tolerating all exericses with no report of increased pain. Pt still limited with ER, IR and Abduction. Flexion is WNL. We reviewed verbally pt's HEP. Continue with skilled PT as pt progresses.     Personal Factors and Comorbidities  Time since onset of injury/illness/exacerbation    Stability/Clinical Decision Making  Stable/Uncomplicated    Rehab Potential  Good    PT Frequency  3x / week    PT Duration  6 weeks    PT Treatment/Interventions  Passive range of motion;Neuromuscular re-education;Moist Heat;Ultrasound;Manual techniques;Dry needling;Joint Manipulations;Patient/family education;Cryotherapy;Electrical Stimulation;Therapeutic exercise;Taping;Vasopneumatic Device    PT Next Visit Plan  PROM and AAROM Rt shoulder, scap stability and cervical motion, modalities PRN     PT Home Exercise Plan  isometrics, pendulums, shoulder flexion and ER AAROM in supine with cane, table slides flexion and ER, IR with towel,     Consulted and Agree with Plan of Care  Patient       Patient will benefit from skilled therapeutic intervention in order to improve the following deficits and impairments:  Pain, Increased muscle spasms, Decreased range of motion, Decreased strength, Impaired UE functional  use  Visit Diagnosis: Stiffness of right  shoulder, not elsewhere classified  Acute pain of right shoulder  Muscle weakness (generalized)     Problem List Patient Active Problem List   Diagnosis Date Noted  . Adhesive capsulitis of right shoulder 04/26/2018  . Impingement syndrome of right shoulder   . Tear of right supraspinatus tendon 01/31/2018  . Chronic right shoulder pain 10/18/2016  . Angioedema 04/30/2015  . Allergic rhinitis 04/30/2015    Sharmon Leyden, PT 09/28/2018, 12:03 PM  [facility] [address] [place], [place], [address] Phone: [phone]   Fax:  [phone]  Name: Leah Khan MRN: [id number] Date of Birth: 26-Dec-1965  PHYSICAL THERAPY DISCHARGE SUMMARY  Visits from Start of Care: 8  Current functional level related to goals / functional outcomes: Spoke to Ms Wootten and she reported she has been getting Pt from another clinic and was good with Discharge.    Remaining deficits: Unknown   Education / Equipment: HEP Plan: Patient agrees to discharge.  Patient goals were not met. Patient is being discharged due to the patient's request.  ?????    Lucy Antigua PT  10/31/18

## 2018-10-04 ENCOUNTER — Ambulatory Visit: Payer: BLUE CROSS/BLUE SHIELD | Admitting: Physical Therapy

## 2018-10-05 ENCOUNTER — Ambulatory Visit: Payer: BLUE CROSS/BLUE SHIELD

## 2018-10-06 ENCOUNTER — Ambulatory Visit: Payer: BLUE CROSS/BLUE SHIELD | Admitting: Physical Therapy

## 2018-10-17 ENCOUNTER — Telehealth: Payer: Self-pay | Admitting: Physical Therapy

## 2018-10-17 NOTE — Telephone Encounter (Signed)
Leah Khan was contacted today regarding the temporary reduction of OP Rehab Services due to concerns for community transmission of Covid-19.    Therapist advised the patient to continue to perform their HEP and assured they had no unanswered questions at this time.  The patient expressed interest in being contacted for an e-visit, virtual check in, or telehealth visit to continue their POC care, when those services become available.     Outpatient Rehabilitation Services will follow up with patients at that time.

## 2018-10-26 ENCOUNTER — Encounter (INDEPENDENT_AMBULATORY_CARE_PROVIDER_SITE_OTHER): Payer: Self-pay | Admitting: Orthopaedic Surgery

## 2018-10-26 ENCOUNTER — Other Ambulatory Visit: Payer: Self-pay

## 2018-10-26 ENCOUNTER — Ambulatory Visit (INDEPENDENT_AMBULATORY_CARE_PROVIDER_SITE_OTHER): Payer: BLUE CROSS/BLUE SHIELD | Admitting: Orthopaedic Surgery

## 2018-10-26 DIAGNOSIS — M7501 Adhesive capsulitis of right shoulder: Secondary | ICD-10-CM | POA: Diagnosis not present

## 2018-10-26 MED ORDER — METHYLPREDNISOLONE ACETATE 40 MG/ML IJ SUSP: 40.0000 mg | Freq: Once | INTRAMUSCULAR | Status: DC

## 2018-10-26 MED ORDER — OXYCODONE-ACETAMINOPHEN 5-325 MG PO TABS: 1.0000 | ORAL_TABLET | Freq: Every evening | ORAL | 0 refills | Status: DC | PRN

## 2018-10-26 MED ORDER — METHOCARBAMOL 500 MG PO TABS: 500.0000 mg | ORAL_TABLET | Freq: Every evening | ORAL | 2 refills | Status: DC | PRN

## 2018-10-26 NOTE — Addendum Note (Signed)
Addended by: Lillia Carmel on: 10/26/2018 11:36 AM   Modules accepted: Orders

## 2018-10-26 NOTE — Progress Notes (Signed)
Subjective: Patient is here for ultrasound-guided intra-articular right glenohumeral injection.  Objective: Pain with decreased range of motion on external rotation and overhead reach.  Procedure: Ultrasound-guided right glenohumeral injection: After sterile prep with Betadine, injected 8 cc 1% lidocaine without epinephrine and 40 mg methylprednisolone using a 22-gauge spinal needle, passing the needle through approach into the glenohumeral joint.  Injectate was seen filling the joint capsule.  She did not have any pain afterward, but complained that she was unable to actively move her arm.  I was able to passively move it the same as before the procedure.  She will call me tomorrow if still having this trouble.  Otherwise follow-up as directed.

## 2018-10-26 NOTE — Progress Notes (Signed)
Patient ID: Leah Khan, female   DOB: 10-20-1965, 53 y.o.   MRN: [id number]  Deara is following up today for her 6-week postoperative visit.  She is status post right shoulder manipulation and lysis of adhesions.  She has had to do video conferencing for outpatient PT due to the coronavirus.  Overall she is doing better.  Her range of motion and function are both improving.  She mainly complains of throbbing pain and muscle spasms at night.  Right shoulder examination shows fully healed surgical scars.  Flexion 135 degrees.  External rotation 35 degrees.  Abduction 95 degrees.  Internal rotation right hip.  Overall I am happy with her improvement and certainly shows that the manipulation and shoulder arthroscopy has helped.  She will continue to work aggressively on improving her range of motion.  Today we arranged for her to get another intra-articular steroid injection with Dr. Prince Rome.  I would like to recheck her in 6 weeks.  I refilled her oxycodone to use sparingly at night and gave her prescription for muscle relaxer.

## 2018-10-31 ENCOUNTER — Telehealth: Payer: Self-pay | Admitting: Physical Therapy

## 2018-10-31 NOTE — Telephone Encounter (Signed)
Spoke to Leah Khan and she stated that she was working with another PT clinic and was OK'd to be discharged.

## 2018-12-07 ENCOUNTER — Ambulatory Visit: Payer: Self-pay | Admitting: Orthopaedic Surgery

## 2018-12-19 ENCOUNTER — Encounter: Payer: Self-pay | Admitting: Orthopaedic Surgery

## 2018-12-19 ENCOUNTER — Ambulatory Visit (INDEPENDENT_AMBULATORY_CARE_PROVIDER_SITE_OTHER): Payer: BC Managed Care – PPO | Admitting: Orthopaedic Surgery

## 2018-12-19 ENCOUNTER — Other Ambulatory Visit: Payer: Self-pay

## 2018-12-19 DIAGNOSIS — M25511 Pain in right shoulder: Secondary | ICD-10-CM | POA: Diagnosis not present

## 2018-12-19 DIAGNOSIS — Z9889 Other specified postprocedural states: Secondary | ICD-10-CM

## 2018-12-19 MED ORDER — LIDOCAINE HCL 2 % IJ SOLN
2.0000 mL | INTRAMUSCULAR | Status: AC | PRN
  Administered 2018-12-19: 2 mL

## 2018-12-19 MED ORDER — METHOCARBAMOL 750 MG PO TABS: 750.0000 mg | ORAL_TABLET | Freq: Every day | ORAL | 0 refills | Status: DC | PRN

## 2018-12-19 MED ORDER — BUPIVACAINE HCL 0.25 % IJ SOLN
2.0000 mL | INTRAMUSCULAR | Status: AC | PRN
  Administered 2018-12-19: 2 mL via INTRA_ARTICULAR

## 2018-12-19 MED ORDER — METHYLPREDNISOLONE ACETATE 40 MG/ML IJ SUSP
40.0000 mg | INTRAMUSCULAR | Status: AC | PRN
  Administered 2018-12-19: 40 mg via INTRA_ARTICULAR

## 2018-12-19 NOTE — Progress Notes (Signed)
Procedure Note  Patient: Leah Khan             Date of Birth: 1965-11-02           MRN: [id number]             Visit Date: 12/19/2018  Procedures: Visit Diagnoses: S/P arthroscopy of right shoulder - Plan: Large Joint Inj: R subacromial bursa  Large Joint Inj: R subacromial bursa on 12/19/2018 10:08 AM Indications: pain Details: 22 G needle Medications: 2 mL bupivacaine 0.25 %; 2 mL lidocaine 2 %; 40 mg methylPREDNISolone acetate 40 MG/ML Outcome: tolerated well, no immediate complications Patient was prepped and draped in the usual sterile fashion.

## 2018-12-19 NOTE — Progress Notes (Signed)
Post-Op Visit Note   Patient: Leah Khan           Date of Birth: April 05, 1966           MRN: [id number] Visit Date: 12/19/2018 PCP: Lupita Raider, MD   Assessment & Plan:  Chief Complaint:  Chief Complaint  Patient presents with  . Right Shoulder - Follow-up   Visit Diagnoses:  1. S/P arthroscopy of right shoulder     Plan: Patient is a pleasant 53 year old female who presents our clinic today 97 days status post right shoulder arthroscopic lysis of adhesions, subacromial decompression and manipulation under anesthesia, date of surgery 09/13/2018.  She has struggled with pain and regaining range of motion following surgical intervention.  She had several weeks of video PT due to the coated pandemic.  She has been physically seen over the past few weeks and an outpatient physical therapy setting where she feels as though she is making progress.  She did have a glenohumeral injection by Dr. Prince Rome on 10/26/2018 which she states did not help.  She does note that she is having pain into the deltoid which has somewhat limited her progressing with physical therapy over the past few weeks.  Examination of her right shoulder shows near full forward flexion, abduction and external rotation.  She can internally rotate to the back pocket.  Today, we discussed the need to continue with formal physical therapy.  Also discussed proceeding with a subacromial cortisone injection to try and alleviate some of her pain.  She would like to proceed with the injection today.  She will follow-up with [place] in 6 weeks time for repeat evaluation.  Follow-Up Instructions: Return in about 6 weeks (around 01/30/2019).   Orders:  Orders Placed This Encounter  Procedures  . Large Joint Inj: R subacromial bursa   Meds ordered this encounter  Medications  . methocarbamol (ROBAXIN) 750 MG tablet    Sig: Take 1 tablet (750 mg total) by mouth daily as needed for muscle spasms.    Dispense:  30 tablet    Refill:  0    Imaging: No new imaging  PMFS History: Patient Active Problem List   Diagnosis Date Noted  . S/P arthroscopy of right shoulder 12/19/2018  . Adhesive capsulitis of right shoulder 04/26/2018  . Impingement syndrome of right shoulder   . Tear of right supraspinatus tendon 01/31/2018  . Chronic right shoulder pain 10/18/2016  . Angioedema 04/30/2015  . Allergic rhinitis 04/30/2015   Past Medical History:  Diagnosis Date  . Adhesive capsulitis of right shoulder   . Angioedema 04/30/2015  . Hypertension   . Migraine   . Rotator cuff disorder, right     Family History  Problem Relation Age of Onset  . Alcoholism Father   . Diabetes Maternal Grandmother     Past Surgical History:  Procedure Laterality Date  . ABDOMINAL HYSTERECTOMY    . BUNIONECTOMY    . SHOULDER ARTHROSCOPY WITH ROTATOR CUFF REPAIR AND SUBACROMIAL DECOMPRESSION Right 03/15/2018   Procedure: RIGHT SHOULDER ARTHROSCOPY WITH EXTENSIVE DEBRIDEMENT, ACROMIOPLASTY, ROTATOR CUFF REPAIR;  Surgeon: Tarry Kos, MD;  Location: [facility];  Service: Orthopedics;  Laterality: Right;  . SHOULDER ARTHROSCOPY WITH SUBACROMIAL DECOMPRESSION Right 09/13/2018   Procedure: RIGHT SHOULDER ARTHROSCOPY WITH LYSIS OF ADHESIONS, SUBACROMIAL DECOMPRESSION AND MANIPULATION UNDER ANESTHESIA;  Surgeon: Tarry Kos, MD;  Location: [facility];  Service: Orthopedics;  Laterality: Right;  . TONSILLECTOMY     Social History  Occupational History  . Not on file  Tobacco Use  . Smoking status: Never Smoker  . Smokeless tobacco: Never Used  Substance and Sexual Activity  . Alcohol use: No    Alcohol/week: 0.0 standard drinks  . Drug use: No  . Sexual activity: Not on file

## 2019-01-14 ENCOUNTER — Other Ambulatory Visit: Payer: Self-pay | Admitting: Physician Assistant

## 2019-01-30 ENCOUNTER — Ambulatory Visit: Payer: BC Managed Care – PPO | Admitting: Orthopaedic Surgery

## 2019-02-13 ENCOUNTER — Other Ambulatory Visit: Payer: Self-pay

## 2019-02-13 ENCOUNTER — Ambulatory Visit (INDEPENDENT_AMBULATORY_CARE_PROVIDER_SITE_OTHER): Payer: BC Managed Care – PPO | Admitting: Orthopaedic Surgery

## 2019-02-13 ENCOUNTER — Encounter: Payer: Self-pay | Admitting: Orthopaedic Surgery

## 2019-02-13 VITALS — Ht 65.0 in | Wt 139.0 lb

## 2019-02-13 DIAGNOSIS — Z9889 Other specified postprocedural states: Secondary | ICD-10-CM | POA: Diagnosis not present

## 2019-02-13 DIAGNOSIS — M7501 Adhesive capsulitis of right shoulder: Secondary | ICD-10-CM

## 2019-02-13 MED ORDER — METHOCARBAMOL 500 MG PO TABS: 500.0000 mg | ORAL_TABLET | Freq: Every day | ORAL | 0 refills | Status: DC | PRN

## 2019-02-13 NOTE — Progress Notes (Signed)
Post-Op Visit Note   Patient: Leah Khan           Date of Birth: 10/16/1965           MRN: [id number] Visit Date: 02/13/2019 PCP: Lupita Raider, MD   Assessment & Plan:  Chief Complaint:  Chief Complaint  Patient presents with  . Right Shoulder - Follow-up   Visit Diagnoses:  1. S/P arthroscopy of right shoulder   2. Adhesive capsulitis of right shoulder     Plan: Patient is a pleasant 53 year old female who presents our clinic today 5 months status post right shoulder arthroscopic lysis of adhesions and subacromial decompression and manipulation under anesthesia.  Date of surgery 09/13/2018.  She has been doing very well over the past few months.  She has finished formal physical therapy and continues to work on a home exercise program.  She has occasional soreness to the top of the shoulder at night as well as pain sleeping on the right side.  Otherwise, she has regained full motion and is back to her normal activities.  Examination of her right shoulder reveals full active range of motion all planes.  Full strength.  She is neurovascular intact distally.  At this point, she will continue with her home exercise program.  We will refill her Robaxin.  She will follow-up with [place] as needed.  Call with concerns or questions anytime.  Follow-Up Instructions: Return if symptoms worsen or fail to improve.   Orders:  No orders of the defined types were placed in this encounter.  No orders of the defined types were placed in this encounter.   Imaging: No new imaging  PMFS History: Patient Active Problem List   Diagnosis Date Noted  . S/P arthroscopy of right shoulder 12/19/2018  . Adhesive capsulitis of right shoulder 04/26/2018  . Impingement syndrome of right shoulder   . Tear of right supraspinatus tendon 01/31/2018  . Chronic right shoulder pain 10/18/2016  . Angioedema 04/30/2015  . Allergic rhinitis 04/30/2015   Past Medical History:  Diagnosis Date  . Adhesive  capsulitis of right shoulder   . Angioedema 04/30/2015  . Hypertension   . Migraine   . Rotator cuff disorder, right     Family History  Problem Relation Age of Onset  . Alcoholism Father   . Diabetes Maternal Grandmother     Past Surgical History:  Procedure Laterality Date  . ABDOMINAL HYSTERECTOMY    . BUNIONECTOMY    . SHOULDER ARTHROSCOPY WITH ROTATOR CUFF REPAIR AND SUBACROMIAL DECOMPRESSION Right 03/15/2018   Procedure: RIGHT SHOULDER ARTHROSCOPY WITH EXTENSIVE DEBRIDEMENT, ACROMIOPLASTY, ROTATOR CUFF REPAIR;  Surgeon: Tarry Kos, MD;  Location: [facility];  Service: Orthopedics;  Laterality: Right;  . SHOULDER ARTHROSCOPY WITH SUBACROMIAL DECOMPRESSION Right 09/13/2018   Procedure: RIGHT SHOULDER ARTHROSCOPY WITH LYSIS OF ADHESIONS, SUBACROMIAL DECOMPRESSION AND MANIPULATION UNDER ANESTHESIA;  Surgeon: Tarry Kos, MD;  Location: [facility];  Service: Orthopedics;  Laterality: Right;  . TONSILLECTOMY     Social History   Occupational History  . Not on file  Tobacco Use  . Smoking status: Never Smoker  . Smokeless tobacco: Never Used  Substance and Sexual Activity  . Alcohol use: No    Alcohol/week: 0.0 standard drinks  . Drug use: No  . Sexual activity: Not on file

## 2019-04-09 ENCOUNTER — Other Ambulatory Visit (INDEPENDENT_AMBULATORY_CARE_PROVIDER_SITE_OTHER): Payer: Self-pay | Admitting: Orthopaedic Surgery

## 2019-04-10 NOTE — Telephone Encounter (Signed)
Ok to do

## 2019-04-19 ENCOUNTER — Ambulatory Visit (INDEPENDENT_AMBULATORY_CARE_PROVIDER_SITE_OTHER): Payer: BC Managed Care – PPO | Admitting: Orthopaedic Surgery

## 2019-04-19 ENCOUNTER — Ambulatory Visit: Payer: Self-pay

## 2019-04-19 ENCOUNTER — Encounter: Payer: Self-pay | Admitting: Orthopaedic Surgery

## 2019-04-19 DIAGNOSIS — M5412 Radiculopathy, cervical region: Secondary | ICD-10-CM | POA: Diagnosis not present

## 2019-04-19 MED ORDER — PREDNISONE 10 MG (21) PO TBPK: ORAL_TABLET | ORAL | 0 refills | Status: DC

## 2019-04-19 NOTE — Progress Notes (Signed)
Office Visit Note   Patient: Leah Khan           Date of Birth: 1965/07/23           MRN: [id number] Visit Date: 04/19/2019              Requested by: Lupita Raider, MD [address] [place],  [place] [address] PCP: Lupita Raider, MD   Assessment & Plan: Visit Diagnoses:  1. Radiculopathy of cervical spine     Plan: Impression is cervical strain with radiculopathy.  I will start the patient on a Sterapred taper.  She will take her current Robaxin prescription twice daily as needed.  We will also start her in physical therapy.  She will follow-up with [place] as needed.  Follow-Up Instructions: Return if symptoms worsen or fail to improve.   Orders:  Orders Placed This Encounter  Procedures  . XR Cervical Spine 2 or 3 views   Meds ordered this encounter  Medications  . predniSONE (STERAPRED UNI-PAK 21 TAB) 10 MG (21) TBPK tablet    Sig: Take as directed    Dispense:  21 tablet    Refill:  0      Procedures: No procedures performed   Clinical Data: No additional findings.   Subjective: Chief Complaint  Patient presents with  . Right Shoulder - Pain    HPI patient is a pleasant 53 year old female who presents our clinic today 7 months status post right shoulder arthroscopic lysis of adhesions, subacromial decompression and manipulation under anesthesia.  She was slow to progress but had regained full range of motion and was with very little to any pain when I saw her at her 41-month postoperative visit.  She notes that little over a month ago she started having pain to the right lateral neck down her entire arm and into her hand.  No new injury or change in activity.  She describes this as an ache with associated weakness.  The pain appears to be worse when she is turning her neck from left to right or as well as when she extends it backwards.  She denies any numbness, tingling or burning.  Review of Systems as detailed in HPI.  All others reviewed  and are negative.   Objective: Vital Signs: There were no vitals taken for this visit.  Physical Exam well-developed and well-nourished female in no acute distress.  Alert and oriented x3.  Ortho Exam examination of the cervical spine reveals mild spinous tenderness.  Mild to moderate paraspinous musculature tenderness.  Increased pain with cervical spine extension and rotation to left and right.  Full active range of motion of the shoulder.  No focal weakness.  She is neurovascularly intact distally.  Specialty Comments:  No specialty comments available.  Imaging: Xr Cervical Spine 2 Or 3 Views  Result Date: 04/19/2019 Abnormal straightening of the cervical spine.  She does have anterior spurring at C5 and C6    PMFS History: Patient Active Problem List   Diagnosis Date Noted  . S/P arthroscopy of right shoulder 12/19/2018  . Adhesive capsulitis of right shoulder 04/26/2018  . Impingement syndrome of right shoulder   . Tear of right supraspinatus tendon 01/31/2018  . Chronic right shoulder pain 10/18/2016  . Angioedema 04/30/2015  . Allergic rhinitis 04/30/2015   Past Medical History:  Diagnosis Date  . Adhesive capsulitis of right shoulder   . Angioedema 04/30/2015  . Hypertension   . Migraine   . Rotator  cuff disorder, right     Family History  Problem Relation Age of Onset  . Alcoholism Father   . Diabetes Maternal Grandmother     Past Surgical History:  Procedure Laterality Date  . ABDOMINAL HYSTERECTOMY    . BUNIONECTOMY    . SHOULDER ARTHROSCOPY WITH ROTATOR CUFF REPAIR AND SUBACROMIAL DECOMPRESSION Right 03/15/2018   Procedure: RIGHT SHOULDER ARTHROSCOPY WITH EXTENSIVE DEBRIDEMENT, ACROMIOPLASTY, ROTATOR CUFF REPAIR;  Surgeon: Tarry Kos, MD;  Location: [facility];  Service: Orthopedics;  Laterality: Right;  . SHOULDER ARTHROSCOPY WITH SUBACROMIAL DECOMPRESSION Right 09/13/2018   Procedure: RIGHT SHOULDER ARTHROSCOPY WITH LYSIS OF ADHESIONS,  SUBACROMIAL DECOMPRESSION AND MANIPULATION UNDER ANESTHESIA;  Surgeon: Tarry Kos, MD;  Location: [facility];  Service: Orthopedics;  Laterality: Right;  . TONSILLECTOMY     Social History   Occupational History  . Not on file  Tobacco Use  . Smoking status: Never Smoker  . Smokeless tobacco: Never Used  Substance and Sexual Activity  . Alcohol use: No    Alcohol/week: 0.0 standard drinks  . Drug use: No  . Sexual activity: Not on file

## 2019-06-18 ENCOUNTER — Other Ambulatory Visit: Payer: Self-pay | Admitting: Orthopedic Surgery

## 2019-06-18 DIAGNOSIS — M25511 Pain in right shoulder: Secondary | ICD-10-CM

## 2019-06-18 DIAGNOSIS — M542 Cervicalgia: Secondary | ICD-10-CM

## 2019-06-24 ENCOUNTER — Other Ambulatory Visit: Payer: Self-pay

## 2019-06-24 ENCOUNTER — Ambulatory Visit
Admission: RE | Admit: 2019-06-24 | Discharge: 2019-06-24 | Disposition: A | Discharge status: Home / Self Care | Payer: BC Managed Care – PPO | Source: Ambulatory Visit | Attending: Orthopedic Surgery | Admitting: Orthopedic Surgery

## 2019-06-24 DIAGNOSIS — M25511 Pain in right shoulder: Secondary | ICD-10-CM

## 2019-06-24 DIAGNOSIS — M542 Cervicalgia: Secondary | ICD-10-CM

## 2019-07-13 HISTORY — PX: ANTERIOR CERVICAL DISCECTOMY: SHX1160

## 2019-08-30 ENCOUNTER — Ambulatory Visit: Payer: BC Managed Care – PPO

## 2019-09-03 ENCOUNTER — Ambulatory Visit: Payer: BC Managed Care – PPO | Attending: Family

## 2019-09-03 DIAGNOSIS — Z23 Encounter for immunization: Secondary | ICD-10-CM | POA: Insufficient documentation

## 2019-09-03 NOTE — Progress Notes (Signed)
Covid-19 Vaccination Clinic  Name:  Leah Khan    MRN: [id number] DOB: 12-17-1965  09/03/2019  [name] was observed post Covid-19 immunization for 15 minutes without incidence. She was provided with Vaccine Information Sheet and instruction to access the V-Safe system.   [name] was instructed to call 911 with any severe reactions post vaccine: Marland Kitchen Difficulty breathing  . Swelling of your face and throat  . A fast heartbeat  . A bad rash all over your body  . Dizziness and weakness    Immunizations Administered    Name Date Dose VIS Date Route   Moderna COVID-19 Vaccine 09/03/2019 10:23 AM 0.5 mL 06/12/2019 Intramuscular   Manufacturer: Moderna   Lot: [id number]   NDC: 14782-956-21

## 2019-10-02 ENCOUNTER — Ambulatory Visit: Payer: BC Managed Care – PPO | Attending: Family

## 2019-10-02 DIAGNOSIS — Z23 Encounter for immunization: Secondary | ICD-10-CM

## 2019-10-02 NOTE — Progress Notes (Signed)
Covid-19 Vaccination Clinic  Name:  Leah Khan    MRN: [id number] DOB: November 02, 1965  10/02/2019  [name] was observed post Covid-19 immunization for 15 minutes without incident. She was provided with Vaccine Information Sheet and instruction to access the V-Safe system.   [name] was instructed to call 911 with any severe reactions post vaccine: Marland Kitchen Difficulty breathing  . Swelling of face and throat  . A fast heartbeat  . A bad rash all over body  . Dizziness and weakness   Immunizations Administered    Name Date Dose VIS Date Route   Moderna COVID-19 Vaccine 10/02/2019 12:33 PM 0.5 mL 06/12/2019 Intramuscular   Manufacturer: Moderna   Lot: [id number]   NDC: 13244-010-27

## 2020-04-15 ENCOUNTER — Ambulatory Visit: Payer: BC Managed Care – PPO | Admitting: Orthopaedic Surgery

## 2020-04-22 ENCOUNTER — Ambulatory Visit: Payer: BC Managed Care – PPO | Admitting: Orthopaedic Surgery

## 2020-04-30 ENCOUNTER — Telehealth: Payer: Self-pay | Admitting: Orthopaedic Surgery

## 2020-04-30 NOTE — Telephone Encounter (Signed)
Received vm from patient requesting copy of records. IC,lmvm advised need to sign release form. Advised to come by the office or we can send to her. Once received, will call when ready.

## 2020-06-11 IMAGING — XA DG FLUORO GUIDE NDL PLC/BX
1 series · 1 of 1 positions shown · non-contrast
Comparison: none

CLINICAL DATA: Right shoulder pain. Limited range of motion. Pain
with abduction.

[Series 1: ortho standard · 1 of 1 slices shown]
[im 1/1]
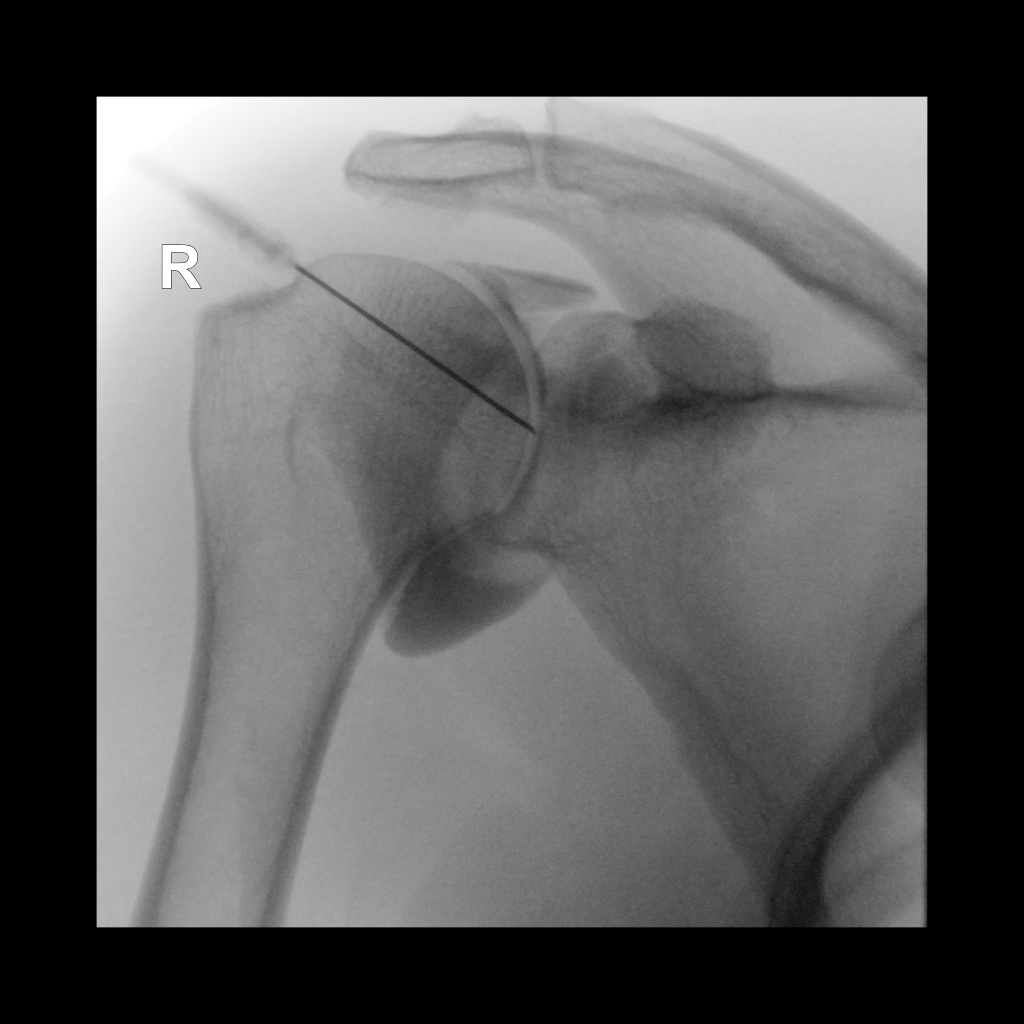

[1 of 1 positions shown; findings below may reference images not displayed]

FLUOROSCOPY TIME:  Radiation Exposure Index (as provided by the
fluoroscopic device): 10.01 uGy*m2

Fluoroscopy Time:  12 seconds

Number of Acquired Images:  0

PROCEDURE:
Right SHOULDER INJECTION UNDER FLUOROSCOPY

An appropriate skin entrance site was determined. The site was
marked, prepped with Betadine, draped in the usual sterile fashion,
and infiltrated locally with buffered Lidocaine. 22 gauge spinal
needle was advanced to the superomedial margin of the humeral head
under intermittent fluoroscopy. 1 ml of Lidocaine injected easily. A
mixture of 0.1 ml Multihance and 20 ml of dilute Isovue-M 200 was
then used to opacify the right shoulder capsule. No immediate
complication.
IMPRESSION: Technically successful right shoulder injection for MRI.

## 2020-06-11 IMAGING — MR MR SHOULDER*R* W/CM
6 series · 40 of 40 positions shown · IV contrast (agent unspecified)
Comparison: None.

CLINICAL DATA: Right shoulder pain, limited range of motion

EXAM:
MR ARTHROGRAM OF THE RIGHT SHOULDER
TECHNIQUE: Multiplanar, multisequence MR imaging of the right shoulder was
performed following the administration of intra-articular contrast.
CONTRAST:  See Injection Documentation.

[Series 3: T1 fat-sat · axial · 4.0mm · 0.27mm/px · z∈[-29,+53]mm · 8 of 18 slices shown (1 of 4)]
[im 1/18]
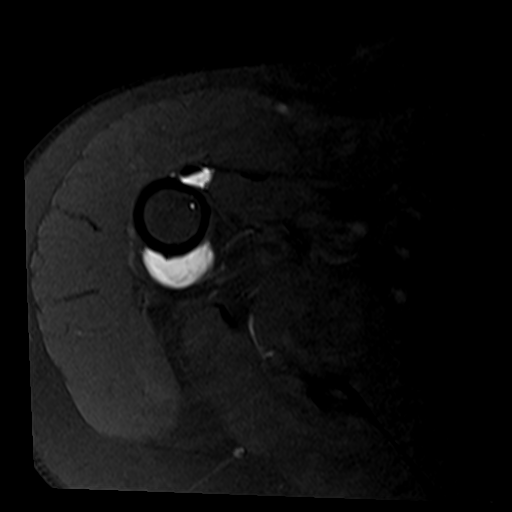
[im 3/18]
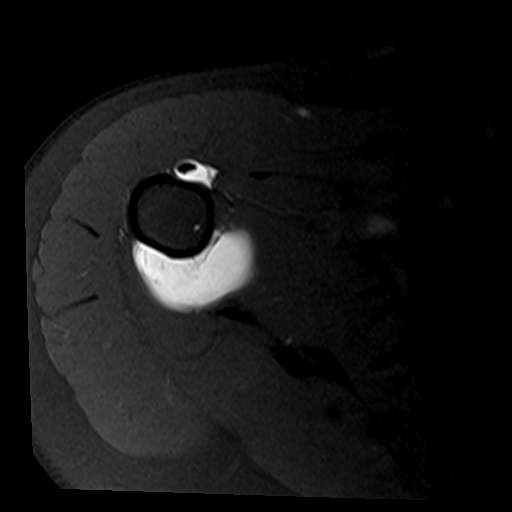
[im 5/18]
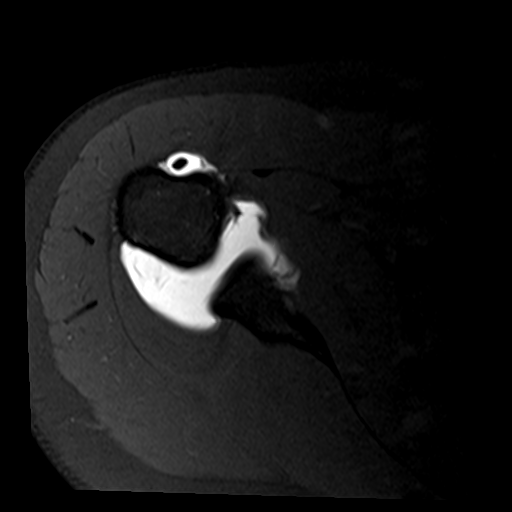
[im 8/18]
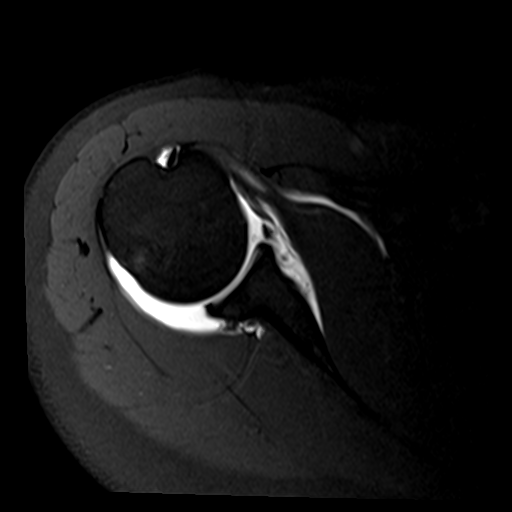
[im 10/18]
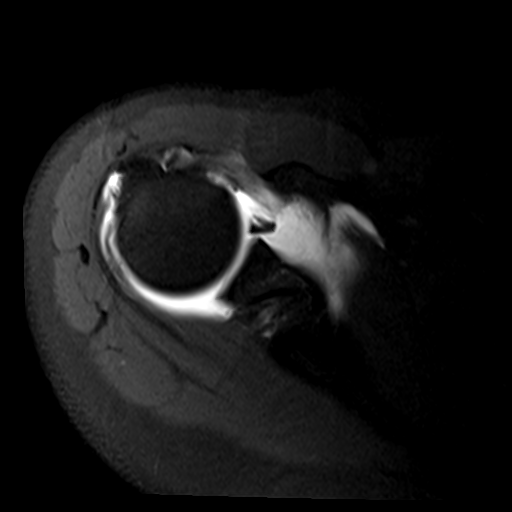
[im 13/18]
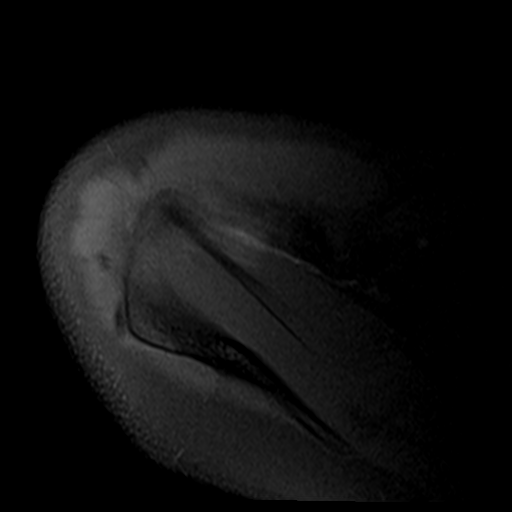
[im 15/18]
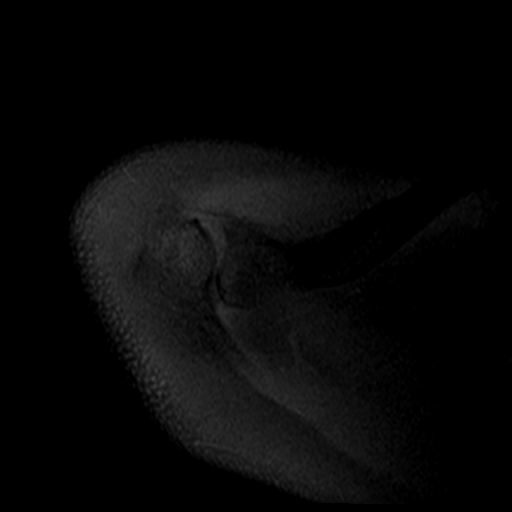
[im 18/18]
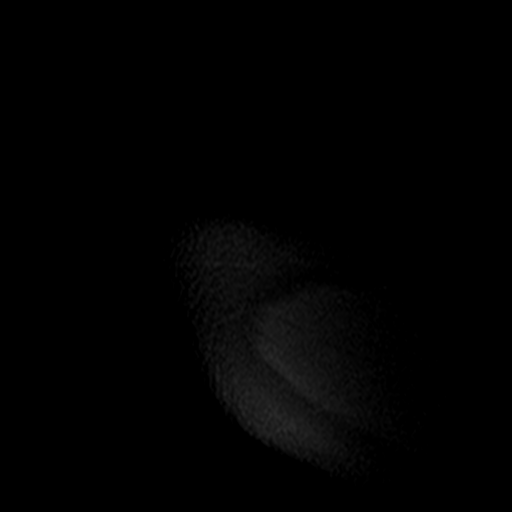

[Series 4: T2 fat-sat · oblique · 4.0mm · 0.55mm/px · 8 of 19 slices shown (1 of 2)]
[im 1/19]
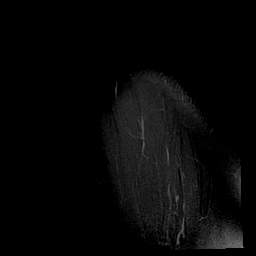
[im 3/19]
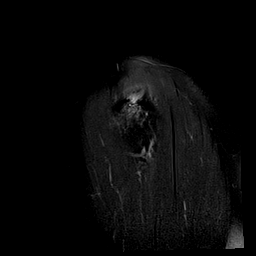
[im 6/19]
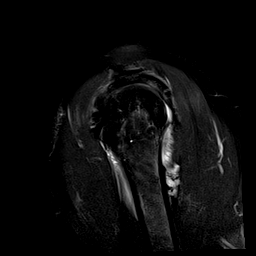
[im 8/19]
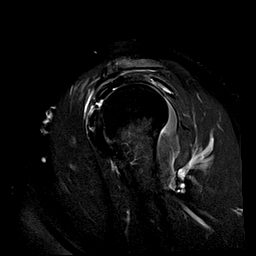
[im 11/19]
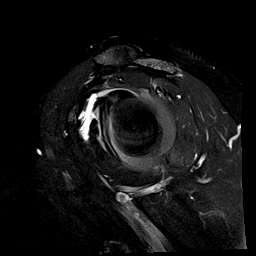
[im 13/19]
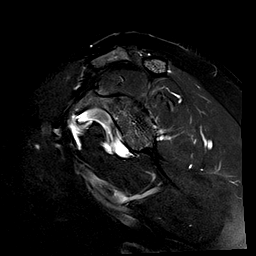
[im 16/19]
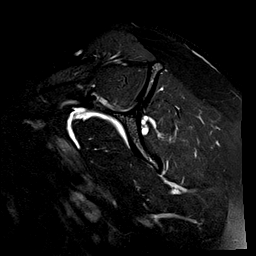
[im 19/19]
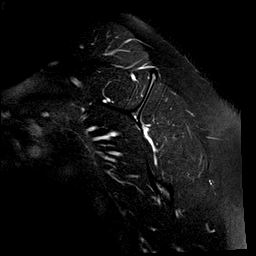

[Series 5: T1 fat-sat · oblique · 4.0mm · 0.55mm/px · 6 of 16 slices shown (2 of 4)]
[im 1/16]
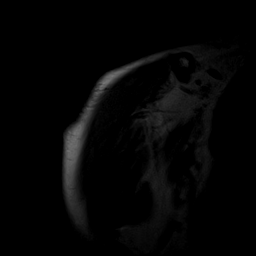
[im 4/16]
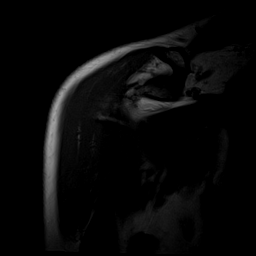
[im 7/16]
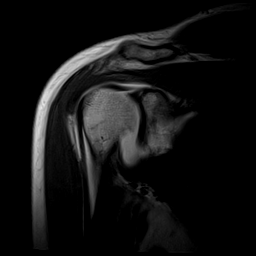
[im 10/16]
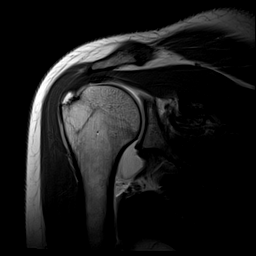
[im 13/16]
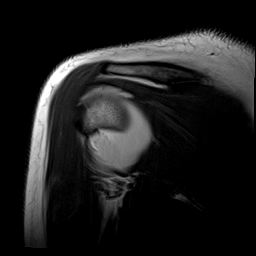
[im 16/16]
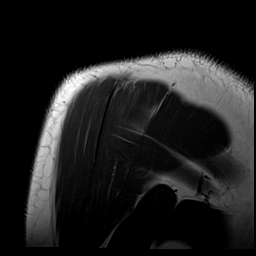

[Series 6: T1 fat-sat · oblique · 4.0mm · 0.55mm/px · 6 of 16 slices shown (3 of 4)]
[im 1/16]
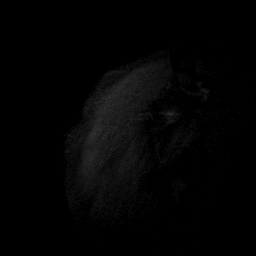
[im 4/16]
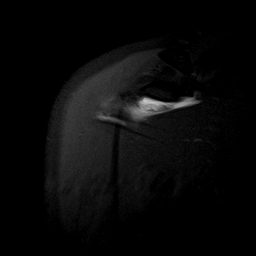
[im 7/16]
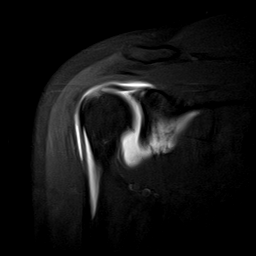
[im 10/16]
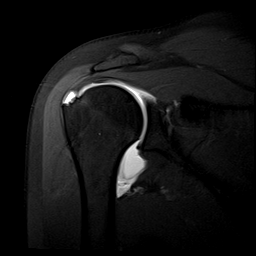
[im 13/16]
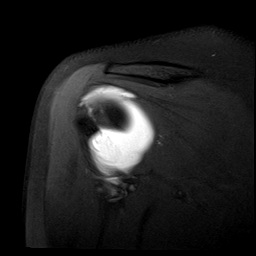
[im 16/16]
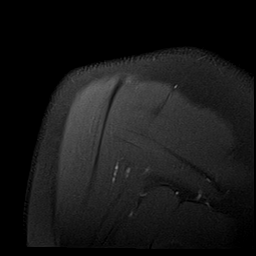

[Series 7: T2 fat-sat · oblique · 4.0mm · 0.55mm/px · 6 of 16 slices shown (2 of 2)]
[im 1/16]
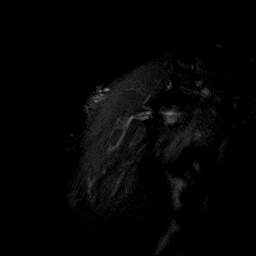
[im 4/16]
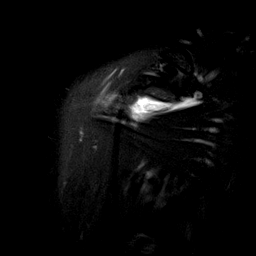
[im 7/16]
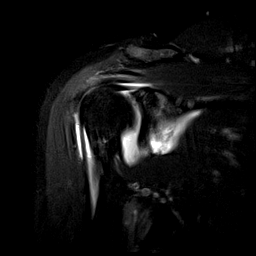
[im 10/16]
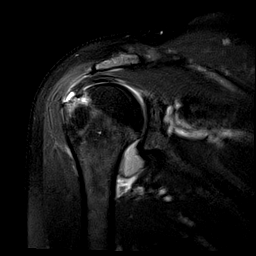
[im 13/16]
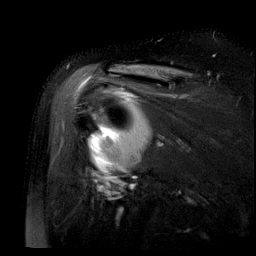
[im 16/16]
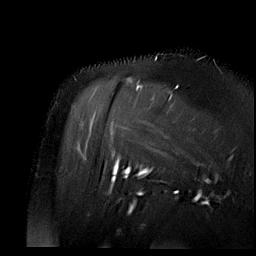

[Series 11: T1 fat-sat · sagittal · 4.0mm · 0.59mm/px · 6 of 16 slices shown (4 of 4)]
[im 1/16]
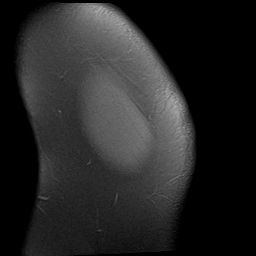
[im 4/16]
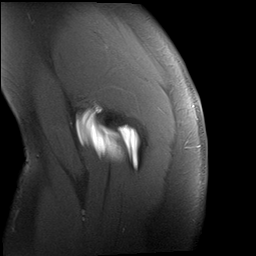
[im 7/16]
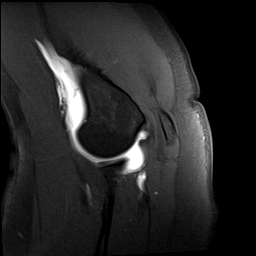
[im 10/16]
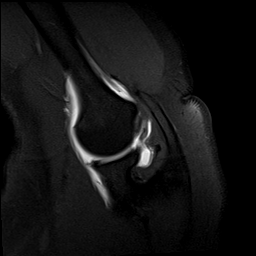
[im 13/16]
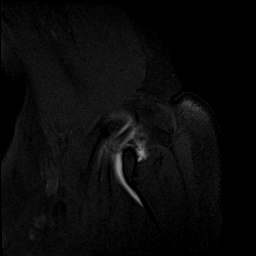
[im 16/16]
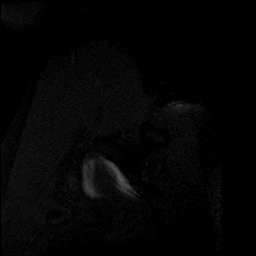

[40 of 40 positions shown; findings below may reference images not displayed]

FINDINGS: Rotator cuff: Severe tendinosis of the supraspinatus tendon with a
large high-grade partial-thickness tear of supraspinatus tendon.
Mild tendinosis of the infraspinatus tendon. Teres minor tendon is
intact. Mild tendinosis of the subscapularis tendon.

Muscles: No atrophy or fatty replacement of nor abnormal signal
within, the muscles of the rotator cuff.

Biceps long head: Intact.

Acromioclavicular Joint: Mild arthropathy of the acromioclavicular
joint. Type I acromion. Trace subacromial/subdeltoid bursal fluid.

Glenohumeral Joint: Intraarticular contrast distending the joint
capsule. No chondral defect. Normal glenohumeral ligaments.

Labrum: Intact.

Bones: No acute osseous abnormality.  No aggressive osseous lesion.
IMPRESSION: 1. Severe tendinosis of the supraspinatus tendon with a large
high-grade partial-thickness tear of supraspinatus tendon.
2. Mild tendinosis of the infraspinatus tendon.
3. Mild tendinosis of the subscapularis tendon.

## 2021-11-23 ENCOUNTER — Other Ambulatory Visit: Payer: Self-pay

## 2021-11-23 ENCOUNTER — Emergency Department (HOSPITAL_BASED_OUTPATIENT_CLINIC_OR_DEPARTMENT_OTHER)
Admission: EM | Admit: 2021-11-23 | Discharge: 2021-11-23 | Disposition: A | Discharge status: Home / Self Care | Payer: BC Managed Care – PPO | Attending: Emergency Medicine | Admitting: Emergency Medicine

## 2021-11-23 ENCOUNTER — Encounter (HOSPITAL_BASED_OUTPATIENT_CLINIC_OR_DEPARTMENT_OTHER): Payer: Self-pay | Admitting: Emergency Medicine

## 2021-11-23 ENCOUNTER — Emergency Department (HOSPITAL_BASED_OUTPATIENT_CLINIC_OR_DEPARTMENT_OTHER): Payer: BC Managed Care – PPO

## 2021-11-23 DIAGNOSIS — S022XXA Fracture of nasal bones, initial encounter for closed fracture: Secondary | ICD-10-CM | POA: Diagnosis not present

## 2021-11-23 DIAGNOSIS — W1809XA Striking against other object with subsequent fall, initial encounter: Secondary | ICD-10-CM | POA: Diagnosis not present

## 2021-11-23 DIAGNOSIS — S0992XA Unspecified injury of nose, initial encounter: Secondary | ICD-10-CM | POA: Diagnosis present

## 2021-11-23 MED ORDER — OXYCODONE-ACETAMINOPHEN 5-325 MG PO TABS
1.0000 | ORAL_TABLET | Freq: Once | ORAL | Status: AC
  Administered 2021-11-23: 1 via ORAL
  Filled 2021-11-23: qty 1

## 2021-11-23 NOTE — Discharge Instructions (Signed)
Recommend following up with ENT, Dr. Jearld Fenton.  Call the office to request an appointment regarding your nasal bone fracture.  Recommend no vigorous nose bleeding.  Take Tylenol Motrin as needed for pain.  Come back to ER as needed. ?

## 2021-11-23 NOTE — ED Provider Notes (Addendum)
?[facility] EMERGENCY DEPARTMENT ?Provider Note ? ? ?CSN: [id number] ?Arrival date & time: 11/23/21  1937 ? ?  ? ?History ? ?Chief Complaint  ?Patient presents with  ? Facial Injury  ? ? ?Leah Khan is a 56 y.o. female.  Presents to ER for facial injury.  She states that a hard stick purse fell off a shelf and struck her on the nose.  Has been having pain in her nose ever since.  Moderate, has not taken any medicine for the pain.  No pain with eye movement.  No LOC, otherwise feels fine.  Not on blood thinners. ?HPI ? ?  ? ?Home Medications ?Prior to Admission medications   ?Medication Sig Start Date End Date Taking? Authorizing Provider  ?amLODipine (NORVASC) 5 MG tablet Take 5 mg by mouth daily. 04/24/15   [provider]  ?ibuprofen (ADVIL,MOTRIN) 800 MG tablet Take 1 tablet (800 mg total) by mouth every 8 (eight) hours as needed. 07/26/18   Leandrew Koyanagi, MD  ?methocarbamol (ROBAXIN) 500 MG tablet TAKE 1 TABLET (500 MG TOTAL) BY MOUTH AT BEDTIME AS NEEDED FOR MUSCLE SPASMS. ?Patient not taking: Reported on 04/19/2019 04/10/19   Aundra Dubin, PA-C  ?methocarbamol (ROBAXIN) 750 MG tablet Take 1 tablet (750 mg total) by mouth daily as needed for muscle spasms. 12/19/18   Aundra Dubin, PA-C  ?Multiple Vitamin (MULTIVITAMIN) tablet Take 1 tablet by mouth daily.    [provider]  ?oxyCODONE-acetaminophen (PERCOCET) 5-325 MG tablet Take 1-2 tablets by mouth at bedtime as needed for severe pain. ?Patient not taking: Reported on 04/19/2019 10/26/18   Leandrew Koyanagi, MD  ?predniSONE (STERAPRED UNI-PAK 21 TAB) 10 MG (21) TBPK tablet Take as directed 04/19/19   Aundra Dubin, PA-C  ?promethazine (PHENERGAN) 25 MG tablet Take 1 tablet (25 mg total) by mouth every 6 (six) hours as needed for nausea. ?Patient not taking: Reported on 04/19/2019 09/13/18   Leandrew Koyanagi, MD  ?topiramate (TOPAMAX) 100 MG tablet Take 100 mg by mouth daily. 02/01/15   [provider]  ?zolpidem (AMBIEN) 5 MG  tablet Take 1-2 tablets (5-10 mg total) by mouth at bedtime as needed for sleep. 04/26/18   Leandrew Koyanagi, MD  ?   ? ?Allergies    ?Ace inhibitors, Amitriptyline, Sertraline hcl, and Tape   ? ?Review of Systems   ?Review of Systems  ?HENT:    ?     Nose pain  ?All other systems reviewed and are negative. ? ?Physical Exam ?Updated Vital Signs ?BP (!) 160/86 (BP Location: Left Arm)   Pulse 72   Temp 98.3 ?F (36.8 ?C) (Oral)   Resp 18   Ht 5\' 5"  (1.651 m)   Wt 53.5 kg   SpO2 100%   BMI 19.64 kg/m?  ?Physical Exam ?Vitals and nursing note reviewed.  ?Constitutional:   ?   General: She is not in acute distress. ?   Appearance: She is well-developed.  ?HENT:  ?   Head: Normocephalic.  ?   Comments: There is some tenderness to the bridge of nose, no nasal septal hematoma noted, normal extraocular movements ?Eyes:  ?   Conjunctiva/sclera: Conjunctivae normal.  ?Cardiovascular:  ?   Comments: Warm and well-perfused ?Pulmonary:  ?   Effort: Pulmonary effort is normal. No respiratory distress.  ?Musculoskeletal:     ?   General: No swelling.  ?   Cervical back: Neck supple. No rigidity or tenderness.  ?Skin: ?  General: Skin is warm and dry.  ?   Capillary Refill: Capillary refill takes less than 2 seconds.  ?Neurological:  ?   General: No focal deficit present.  ?   Mental Status: She is alert and oriented to person, place, and time.  ?Psychiatric:     ?   Mood and Affect: Mood normal.  ? ? ?ED Results / Procedures / Treatments   ?Labs ?(all labs ordered are listed, but only abnormal results are displayed) ?Labs Reviewed - No data to display ? ?EKG ?None ? ?Radiology ?DG Nasal Bones ? ?Result Date: 11/23/2021 ?CLINICAL DATA:  Nasal injury EXAM: NASAL BONES - 3+ VIEW COMPARISON:  None Available. FINDINGS: There is a questionable subtle acute nondisplaced fracture of the nasal bone on the left. IMPRESSION: As above. Electronically Signed   By: Ofilia Neas M.D.   On: 11/23/2021 20:47   ? ?Procedures ?Procedures   ? ? ?Medications Ordered in ED ?Medications  ?oxyCODONE-acetaminophen (PERCOCET/ROXICET) 5-325 MG per tablet 1 tablet (has no administration in time range)  ? ? ?ED Course/ Medical Decision Making/ A&P ?  ?                        ?Medical Decision Making ?Amount and/or Complexity of Data Reviewed ?Radiology: ordered. ? ?Risk ?Prescription drug management. ? ? ?56 year old lady presents to ER for nose pain after she was struck by hard purse in a closet.  On exam she has some tenderness to her nose.  Nasal bone x-ray obtained, questional subtle acute nondisplaced fracture noted.  This does correlate with where her tenderness is.  Suspect she may in fact have a mild acute nondisplaced nasal bone fracture.  We will give her information for follow-up with ENT.  She is well-appearing in no distress otherwise, no LOC, no other trauma.  No neck pain or tenderness.  Discharged home. ? ? ?Given dose of Percocet in ER, pain improved.  Advised over-the-counter NSAIDs and Tylenol as needed for pain going forward. ? ?I independently reviewed XR's.  Agree with radiology report. ? ? ? ? ?Final Clinical Impression(s) / ED Diagnoses ?Final diagnoses:  ?Closed fracture of nasal bone, initial encounter  ? ? ?Rx / DC Orders ?ED Discharge Orders   ? ? None  ? ?  ? ? ?  ?Lucrezia Starch, MD ?11/23/21 2152 ? ?  ?Lucrezia Starch, MD ?11/23/21 2152 ? ?

## 2021-11-23 NOTE — ED Triage Notes (Signed)
Pt stated a hard purse fell off shelf in closet and hit pt on nose. Pt states she saw stats when hit and having pain at bridge of nose and under both eyes.  ?

## 2021-11-27 ENCOUNTER — Encounter (INDEPENDENT_AMBULATORY_CARE_PROVIDER_SITE_OTHER): Payer: Self-pay | Admitting: Otolaryngology

## 2021-11-27 ENCOUNTER — Ambulatory Visit (INDEPENDENT_AMBULATORY_CARE_PROVIDER_SITE_OTHER): Payer: BC Managed Care – PPO | Admitting: Otolaryngology

## 2021-11-27 VITALS — BP 143/85 | HR 71 | Ht 65.0 in | Wt 118.0 lb

## 2021-11-27 DIAGNOSIS — S022XXA Fracture of nasal bones, initial encounter for closed fracture: Secondary | ICD-10-CM

## 2021-11-27 DIAGNOSIS — W208XXA Other cause of strike by thrown, projected or falling object, initial encounter: Secondary | ICD-10-CM | POA: Diagnosis not present

## 2021-11-27 NOTE — Progress Notes (Signed)
Leah Khan is an 56 y.o. female.   Chief Complaint: nasal fracture HPI: she has a purse fall on her face about 5 days ago. She has pain. No deviation by her assessment. She has no nasal obstruction or congestion. No eye injury or occlusion issues.   Past Medical History:  Diagnosis Date   Adhesive capsulitis of right shoulder    Angioedema 04/30/2015   Hypertension    Migraine    Rotator cuff disorder, right     Past Surgical History:  Procedure Laterality Date   ABDOMINAL HYSTERECTOMY     BUNIONECTOMY     SHOULDER ARTHROSCOPY WITH ROTATOR CUFF REPAIR AND SUBACROMIAL DECOMPRESSION Right 03/15/2018   Procedure: RIGHT SHOULDER ARTHROSCOPY WITH EXTENSIVE DEBRIDEMENT, ACROMIOPLASTY, ROTATOR CUFF REPAIR;  Surgeon: Leandrew Koyanagi, MD;  Location: [place] [facility];  Service: Orthopedics;  Laterality: Right;   SHOULDER ARTHROSCOPY WITH SUBACROMIAL DECOMPRESSION Right 09/13/2018   Procedure: RIGHT SHOULDER ARTHROSCOPY WITH LYSIS OF ADHESIONS, SUBACROMIAL DECOMPRESSION AND MANIPULATION UNDER ANESTHESIA;  Surgeon: Leandrew Koyanagi, MD;  Location: [facility];  Service: Orthopedics;  Laterality: Right;   TONSILLECTOMY      Family History  Problem Relation Age of Onset   Alcoholism Father    Diabetes Maternal Grandmother    Social History:  reports that she has never smoked. She has never used smokeless tobacco. She reports that she does not drink alcohol and does not use drugs.  Allergies:  Allergies  Allergen Reactions   Ace Inhibitors Anaphylaxis    angioedema   Amitriptyline     Irritability    Sertraline Hcl     Zombie feeling    Tape     (Not in a hospital admission)   No results found for this or any previous visit (from the past 48 hour(s)). No results found.   There were no vitals taken for this visit.  PHYSICAL EXAM: Appearance _ awake alert with no distress.  Head- atraumatic and no obvious abnormalities Eyes- PERRL, EOMI, no conjunctiva  injection or ecchymosis Ears-  Right- Pinna without inflammation or swelling. canal without obstruction or injury. TM within normal limits  Left- Pinna without inflammation or swelling. canal without obstruction or injury. TM within normal limits Nose- no septal injury or deviation. The external nose looks great with only a small swelling over the dorsum. She has pain with palpation but the bones feel lined up. There does not appear to be any deviation by visualization Oc/OP- no lesions or excessive swelling. Mouth opening normal.  Hp/Larynx- normal voice and no airway issues. No swelling or lesions Neck- no mass or lesions. Normal movement.  Neuro- CNII-XII intact, no sensory deficits.  Lungs- normal effort no distress noted     Assessment/Plan Nasal fracture- she has no deviation of the dorsum. The bones feel lined up. There is slight swelling over the dorsum and discomfort preventing firm palpation. She has no septal injury. We talked about the window of 3 weeks to fix this if she sees any deviation which she does not right now. She will call next week if there is any issue with the appearance,  Melissa Montane 11/27/2021, 9:48 AM

## 2022-08-27 ENCOUNTER — Encounter: Payer: Self-pay | Admitting: Obstetrics and Gynecology

## 2022-08-27 ENCOUNTER — Ambulatory Visit (INDEPENDENT_AMBULATORY_CARE_PROVIDER_SITE_OTHER): Payer: BC Managed Care – PPO | Admitting: Obstetrics and Gynecology

## 2022-08-27 VITALS — BP 126/84 | HR 69 | Ht 65.0 in | Wt 122.0 lb

## 2022-08-27 DIAGNOSIS — N3281 Overactive bladder: Secondary | ICD-10-CM

## 2022-08-27 DIAGNOSIS — R350 Frequency of micturition: Secondary | ICD-10-CM

## 2022-08-27 DIAGNOSIS — N816 Rectocele: Secondary | ICD-10-CM

## 2022-08-27 DIAGNOSIS — N952 Postmenopausal atrophic vaginitis: Secondary | ICD-10-CM | POA: Diagnosis not present

## 2022-08-27 LAB — POCT URINALYSIS DIPSTICK
Bilirubin, UA: NEGATIVE
Blood, UA: NEGATIVE
Glucose, UA: NEGATIVE
Ketones, UA: NEGATIVE
Leukocytes, UA: NEGATIVE
Nitrite, UA: NEGATIVE
Protein, UA: NEGATIVE
Spec Grav, UA: 1.025 (ref 1.010–1.025)
Urobilinogen, UA: 0.2 E.U./dL
pH, UA: 7 (ref 5.0–8.0)

## 2022-08-27 MED ORDER — ESTRADIOL 0.1 MG/GM VA CREA: TOPICAL_CREAM | VAGINAL | 11 refills | Status: AC

## 2022-08-27 MED ORDER — TROSPIUM CHLORIDE ER 60 MG PO CP24: 1.0000 | ORAL_CAPSULE | Freq: Every day | ORAL | 5 refills | Status: DC

## 2022-08-27 NOTE — Progress Notes (Signed)
[facility] New Patient Evaluation and Consultation  Referring Provider: Christophe Louis, MD PCP: Mayra Neer, MD Date of Service: 08/27/2022  SUBJECTIVE Chief Complaint: New Patient (Initial Visit) Leah Khan is a 57 y.o. female here for a consult on prolapse.)  History of Present Illness: Leah Khan is a 57 y.o. Black or African-American female seen in consultation at the request of Dr. Landry Mellow for evaluation of prolapse.    Review of records significant for: Walks frequently and notices prolapse with walking  Urinary Symptoms: Does not leak urine.   Day time voids 5-6, but she does have urgency on the way to the bathroom.   Nocturia: 6-7 times per night to void. Does not drink before she goes to bed. Does not snore.  Voiding dysfunction: she empties her bladder well.  does not use a catheter to empty bladder.  When urinating, she feels dribbling after finishing and the need to urinate multiple times in a row  UTIs:  0  UTI's in the last year.   Denies history of blood in urine and kidney or bladder stones  Pelvic Organ Prolapse Symptoms:                  She Admits to a feeling of a bulge the vaginal area. It has been present for 1 year.  She Admits to seeing a bulge.  This bulge is bothersome. She feels it all the time, a lot with walking. Some days is worse than others.   Bowel Symptom: Bowel movements: 1 time(s) per day Stool consistency: hard Straining: no.  Splinting: yes.  Incomplete evacuation: yes.  She Denies accidental bowel leakage / fecal incontinence Bowel regimen: miralax daily- otherwise would be constipated   Sexual Function Sexually active: yes.  Sexual orientation: Straight Pain with sex: sometimes, just has discomfort.   Pelvic Pain Denies pelvic pain    Past Medical History:  Past Medical History:  Diagnosis Date   Adhesive capsulitis of right shoulder    Angioedema 04/30/2015   Hypertension    Migraine    Rotator  cuff disorder, right      Past Surgical History:   Past Surgical History:  Procedure Laterality Date   ABDOMINOPLASTY     BUNIONECTOMY     SHOULDER ARTHROSCOPY WITH ROTATOR CUFF REPAIR AND SUBACROMIAL DECOMPRESSION Right 03/15/2018   Procedure: RIGHT SHOULDER ARTHROSCOPY WITH EXTENSIVE DEBRIDEMENT, ACROMIOPLASTY, ROTATOR CUFF REPAIR;  Surgeon: Leandrew Koyanagi, MD;  Location: [place];  Service: Orthopedics;  Laterality: Right;   SHOULDER ARTHROSCOPY WITH SUBACROMIAL DECOMPRESSION Right 09/13/2018   Procedure: RIGHT SHOULDER ARTHROSCOPY WITH LYSIS OF ADHESIONS, SUBACROMIAL DECOMPRESSION AND MANIPULATION UNDER ANESTHESIA;  Surgeon: Leandrew Koyanagi, MD;  Location: [place];  Service: Orthopedics;  Laterality: Right;   TONSILLECTOMY     VAGINAL HYSTERECTOMY       Past OB/GYN History: OB History  Gravida Para Term Preterm AB Living  3 2 2   1 2  $ SAB IAB Ectopic Multiple Live Births  1       2    # Outcome Date GA Lbr Len/2nd Weight Sex Delivery Anes PTL Lv  3 Term      Vag-Spont     2 Term      Vag-Spont     1 SAB             S/p hysterectomy   Medications: She has a current medication list which includes the following prescription(s): amlodipine, [START ON  08/30/2022] estradiol, estradiol, multivitamin, topiramate, and trospium chloride.   Allergies: Patient is allergic to ace inhibitors, amitriptyline, sertraline hcl, and tape.   Social History:  Social History   Tobacco Use   Smoking status: Never   Smokeless tobacco: Never  Substance Use Topics   Alcohol use: Yes    Comment: occationally   Drug use: No    Relationship status: married She lives with husband.   She is employed. Regular exercise: Yes: walking History of abuse: No  Family History:   Family History  Problem Relation Age of Onset   Hypertension Mother    Diabetes Mother    Alcoholism Father    Diabetes Maternal Grandmother      Review of Systems: Review of  Systems  Constitutional:  Negative for fever, malaise/fatigue and weight loss.  Respiratory:  Negative for cough, shortness of breath and wheezing.   Cardiovascular:  Negative for chest pain, palpitations and leg swelling.  Gastrointestinal:  Negative for abdominal pain and blood in stool.  Genitourinary:  Negative for dysuria.  Musculoskeletal:  Negative for myalgias.  Skin:  Negative for rash.  Neurological:  Positive for headaches. Negative for dizziness.  Endo/Heme/Allergies:  Does not bruise/bleed easily.  Psychiatric/Behavioral:  Negative for depression. The patient is not nervous/anxious.      OBJECTIVE Physical Exam: Vitals:   08/27/22 0757  BP: 126/84  Pulse: 69  Weight: 122 lb (55.3 kg)  Height: 5' 5"$  (1.651 m)    Physical Exam Constitutional:      General: She is not in acute distress. Pulmonary:     Effort: Pulmonary effort is normal.  Abdominal:     General: There is no distension.     Palpations: Abdomen is soft.     Tenderness: There is no abdominal tenderness. There is no rebound.  Musculoskeletal:        General: No swelling. Normal range of motion.  Skin:    General: Skin is warm and dry.     Findings: No rash.  Neurological:     Mental Status: She is alert and oriented to person, place, and time.  Psychiatric:        Mood and Affect: Mood normal.        Behavior: Behavior normal.      GU / Detailed Urogynecologic Evaluation:  Pelvic Exam: Normal external female genitalia; Bartholin's and Skene's glands normal in appearance; urethral meatus normal in appearance, no urethral masses or discharge.   CST: negative  s/p hysterectomy: Speculum exam reveals normal vaginal mucosa with  atrophy and normal vaginal cuff.  Adnexa no mass, fullness, tenderness.    Pelvic floor strength II/V  Pelvic floor musculature: Right levator non-tender, Right obturator non-tender, Left levator non-tender, Left obturator non-tender  POP-Q:   POP-Q  -2.5                                             Aa   -2.5                                           Ba  -5  C   4.5                                            Gh  2.5                                            Pb  6.5                                            tvl   0                                            Ap  0                                            Bp                                                 D      Rectal Exam:  Normal sphincter tone, small distal rectocele, enterocoele not present, no rectal masses, no sign of dyssynergia when asking the patient to bear down.  Post-Void Residual (PVR) by Bladder Scan: In order to evaluate bladder emptying, we discussed obtaining a postvoid residual and she agreed to this procedure.  Procedure: The ultrasound unit was placed on the patient's abdomen in the suprapubic region after the patient had voided. A PVR of 3 ml was obtained by bladder scan.  Laboratory Results: POC urine: negative   ASSESSMENT AND PLAN Leah Khan is a 57 y.o. with:  1. Prolapse of posterior vaginal wall   2. Urinary frequency   3. Vaginal atrophy   4. Overactive bladder     Stage I anterior, Stage II posterior, Stage I apical prolapse - For treatment of pelvic organ prolapse, we discussed options for management including expectant management, conservative management, and surgical management, such as Kegels, a pessary, pelvic floor physical therapy, and specific surgical procedures. - She is interested in surgery. Recommended: posterior repair, perineorrhaphy with possible sacrospinous ligament fixation.  - We reviewed the patient's specific anatomic and functional findings, with the assistance of diagrams and handouts.  We reviewed the treatment options including expectant management, conservative management, medical management, and surgical management.  We reviewed the benefits and risks of each treatment option. We  discussed risks of bleeding, infection, damage to surrounding organs including bowel, bladder, blood vessels, ureters and nerves, need for further surgery, numbness and weakness at any body site, buttock pain, postoperative cognitive dysfunction, and the rarer risks of blood clot, heart attack, pneumonia, death. All her questions were answered and she verbalized understanding.     2. OAB We discussed the symptoms of overactive bladder (OAB), which include urinary urgency, urinary frequency, nocturia, with or without urge incontinence.  While we do  not know the exact etiology of OAB, several treatment options exist. We discussed management including behavioral therapy (decreasing bladder irritants, urge suppression strategies, timed voids, bladder retraining), physical therapy, medication; for refractory cases posterior tibial nerve stimulation, sacral neuromodulation, and intravesical botulinum toxin injection.  - Prescribed Trospium 79m ER daily. For anticholinergic medications, we discussed the potential side effects of anticholinergics including dry eyes, dry mouth, constipation, cognitive impairment and urinary retention.  3. Atrophy - Prescribed estrace cream. Use 0.5g nightly for two weeks then twice a week after.  - Also recommended lubricant during intercourse.   Request sent for surgery scheduling   MJaquita Folds MD

## 2022-08-27 NOTE — Patient Instructions (Addendum)
Start vaginal estrogen therapy nightly for two weeks then 2 times weekly at night for treatment of vaginal atrophy (dryness of the vaginal tissues).  Please let us know if the prescription is too expensive and we can look for alternative options.    A prescription for Trospium has been sent to the pharmacy for urinating at night time.    Plan for surgery: Posterior repair, perineorrhaphy, possible sacrospinous fixation ( lift the top of the vagina if needed)

## 2022-08-30 ENCOUNTER — Encounter: Payer: Self-pay | Admitting: *Deleted

## 2022-11-24 ENCOUNTER — Ambulatory Visit: Payer: BC Managed Care – PPO | Admitting: Podiatry

## 2023-01-26 ENCOUNTER — Encounter: Payer: BC Managed Care – PPO | Admitting: Obstetrics and Gynecology

## 2023-01-31 ENCOUNTER — Ambulatory Visit (INDEPENDENT_AMBULATORY_CARE_PROVIDER_SITE_OTHER): Payer: BC Managed Care – PPO | Admitting: Obstetrics and Gynecology

## 2023-01-31 ENCOUNTER — Encounter: Payer: Self-pay | Admitting: Obstetrics and Gynecology

## 2023-01-31 VITALS — BP 149/88 | HR 75 | Wt 133.0 lb

## 2023-01-31 DIAGNOSIS — N812 Incomplete uterovaginal prolapse: Secondary | ICD-10-CM

## 2023-01-31 DIAGNOSIS — Z01818 Encounter for other preprocedural examination: Secondary | ICD-10-CM

## 2023-01-31 MED ORDER — IBUPROFEN 600 MG PO TABS: 600.0000 mg | ORAL_TABLET | Freq: Four times a day (QID) | ORAL | 0 refills | Status: AC | PRN

## 2023-01-31 MED ORDER — ACETAMINOPHEN 500 MG PO TABS: 500.0000 mg | ORAL_TABLET | Freq: Four times a day (QID) | ORAL | 0 refills | Status: AC | PRN

## 2023-01-31 MED ORDER — POLYETHYLENE GLYCOL 3350 17 GM/SCOOP PO POWD: 17.0000 g | Freq: Every day | ORAL | 0 refills | Status: AC

## 2023-01-31 MED ORDER — OXYCODONE HCL 5 MG PO TABS: 5.0000 mg | ORAL_TABLET | ORAL | 0 refills | Status: DC | PRN

## 2023-01-31 NOTE — H&P (Deleted)
[facility] Pre-Operative Exam  Subjective Chief Complaint: Leah Khan presents for a preoperative encounter.   History of Present Illness: Leah Khan is a 57 y.o. female who presents for preoperative visit.  She is scheduled to undergo Exam under anesthesia, Posterior repair with Perineorrhapy, and possible sacrospinous fixation on 02/28/23.  Her symptoms include pelvic organ prolapse, and she was was found to have Stage I anterior, Stage II posterior, Stage I apical prolapse.   Urodynamics showed: Deferred  Past Medical History:  Diagnosis Date   Adhesive capsulitis of right shoulder    Angioedema 04/30/2015   Hypertension    Migraine    Rotator cuff disorder, right      Past Surgical History:  Procedure Laterality Date   ABDOMINOPLASTY     BUNIONECTOMY     SHOULDER ARTHROSCOPY WITH ROTATOR CUFF REPAIR AND SUBACROMIAL DECOMPRESSION Right 03/15/2018   Procedure: RIGHT SHOULDER ARTHROSCOPY WITH EXTENSIVE DEBRIDEMENT, ACROMIOPLASTY, ROTATOR CUFF REPAIR;  Surgeon: Tarry Kos, MD;  Location: [facility];  Service: Orthopedics;  Laterality: Right;   SHOULDER ARTHROSCOPY WITH SUBACROMIAL DECOMPRESSION Right 09/13/2018   Procedure: RIGHT SHOULDER ARTHROSCOPY WITH LYSIS OF ADHESIONS, SUBACROMIAL DECOMPRESSION AND MANIPULATION UNDER ANESTHESIA;  Surgeon: Tarry Kos, MD;  Location: [facility];  Service: Orthopedics;  Laterality: Right;   TONSILLECTOMY     VAGINAL HYSTERECTOMY      is allergic to ace inhibitors, amitriptyline, sertraline hcl, and tape.   Family History  Problem Relation Age of Onset   Hypertension Mother    Diabetes Mother    Alcoholism Father    Diabetes Maternal Grandmother     Social History   Tobacco Use   Smoking status: Never   Smokeless tobacco: Never  Substance Use Topics   Alcohol use: Yes    Comment: occationally   Drug use: No     Review of Systems was negative for a full 10 system  review except as noted in the History of Present Illness.  No current facility-administered medications for this encounter.  Current Outpatient Medications:    acetaminophen (TYLENOL) 500 MG tablet, Take 1 tablet (500 mg total) by mouth every 6 (six) hours as needed (pain)., Disp: 30 tablet, Rfl: 0   amLODipine (NORVASC) 5 MG tablet, Take 5 mg by mouth daily., Disp: , Rfl:    estradiol (ESTRACE) 0.1 MG/GM vaginal cream, Place 0.5g nightly for two weeks then twice a week after, Disp: 30 g, Rfl: 11   estradiol (ESTRACE) 1 MG tablet, Take 1 mg by mouth daily., Disp: , Rfl:    ibuprofen (ADVIL) 600 MG tablet, Take 1 tablet (600 mg total) by mouth every 6 (six) hours as needed., Disp: 30 tablet, Rfl: 0   Multiple Vitamin (MULTIVITAMIN) tablet, Take 1 tablet by mouth daily., Disp: , Rfl:    oxyCODONE (OXY IR/ROXICODONE) 5 MG immediate release tablet, Take 1 tablet (5 mg total) by mouth every 4 (four) hours as needed for severe pain., Disp: 15 tablet, Rfl: 0   polyethylene glycol powder (GLYCOLAX/MIRALAX) 17 GM/SCOOP powder, Take 17 g by mouth daily. Drink 17g (1 scoop) dissolved in water per day., Disp: 255 g, Rfl: 0   topiramate (TOPAMAX) 100 MG tablet, Take 100 mg by mouth daily., Disp: , Rfl: 3   Objective There were no vitals filed for this visit.   Gen: NAD CV: S1 S2 RRR Lungs: Clear to auscultation bilaterally Abd: soft, nontender   Previous Pelvic Exam showed: Pelvic Exam: Normal external female  genitalia; Bartholin's and Skene's glands normal in appearance; urethral meatus normal in appearance, no urethral masses or discharge.    CST: negative   s/p hysterectomy: Speculum exam reveals normal vaginal mucosa with  atrophy and normal vaginal cuff.  Adnexa no mass, fullness, tenderness.     Pelvic floor strength II/V   Pelvic floor musculature: Right levator non-tender, Right obturator non-tender, Left levator non-tender, Left obturator non-tender   POP-Q:    POP-Q   -2.5                                             Aa   -2.5                                           Ba   -5                                              C    4.5                                            Gh   2.5                                            Pb   6.5                                            tvl    0                                            Ap   0                                            Bp                                                  D          Rectal Exam:  Normal sphincter tone, small distal rectocele, enterocoele not present, no rectal masses, no sign of dyssynergia when asking the patient to bear down.    Assessment/ Plan  Assessment: The patient is a 57 y.o. year old scheduled to undergo Exam under anesthesia, Posterior repair with Perineorrhapy, and possible sacrospinous fixation. Verbal consent was obtained for these procedures.

## 2023-01-31 NOTE — Progress Notes (Signed)
[facility] Pre-Operative Exam  Subjective Chief Complaint: Leah Khan presents for a preoperative encounter.   History of Present Illness: Leah Khan is a 57 y.o. female who presents for preoperative visit.  She is scheduled to undergo Exam under anesthesia, Posterior repair with Perineorrhapy, and possible sacrospinous fixation on 02/28/23.  Her symptoms include pelvic organ prolapse, and she was was found to have Stage I anterior, Stage II posterior, Stage I apical prolapse.   Urodynamics showed: Deferred  Past Medical History:  Diagnosis Date   Adhesive capsulitis of right shoulder    Angioedema 04/30/2015   Hypertension    Migraine    Rotator cuff disorder, right      Past Surgical History:  Procedure Laterality Date   ABDOMINOPLASTY     BUNIONECTOMY     SHOULDER ARTHROSCOPY WITH ROTATOR CUFF REPAIR AND SUBACROMIAL DECOMPRESSION Right 03/15/2018   Procedure: RIGHT SHOULDER ARTHROSCOPY WITH EXTENSIVE DEBRIDEMENT, ACROMIOPLASTY, ROTATOR CUFF REPAIR;  Surgeon: Tarry Kos, MD;  Location: [facility];  Service: Orthopedics;  Laterality: Right;   SHOULDER ARTHROSCOPY WITH SUBACROMIAL DECOMPRESSION Right 09/13/2018   Procedure: RIGHT SHOULDER ARTHROSCOPY WITH LYSIS OF ADHESIONS, SUBACROMIAL DECOMPRESSION AND MANIPULATION UNDER ANESTHESIA;  Surgeon: Tarry Kos, MD;  Location: [facility];  Service: Orthopedics;  Laterality: Right;   TONSILLECTOMY     VAGINAL HYSTERECTOMY      is allergic to ace inhibitors, amitriptyline, sertraline hcl, and tape.   Family History  Problem Relation Age of Onset   Hypertension Mother    Diabetes Mother    Alcoholism Father    Diabetes Maternal Grandmother     Social History   Tobacco Use   Smoking status: Never   Smokeless tobacco: Never  Substance Use Topics   Alcohol use: Yes    Comment: occationally   Drug use: No     Review of Systems was negative for a full 10 system  review except as noted in the History of Present Illness.   Current Outpatient Medications:    amLODipine (NORVASC) 5 MG tablet, Take 5 mg by mouth daily., Disp: , Rfl:    estradiol (ESTRACE) 0.1 MG/GM vaginal cream, Place 0.5g nightly for two weeks then twice a week after, Disp: 30 g, Rfl: 11   estradiol (ESTRACE) 1 MG tablet, Take 1 mg by mouth daily., Disp: , Rfl:    Multiple Vitamin (MULTIVITAMIN) tablet, Take 1 tablet by mouth daily., Disp: , Rfl:    topiramate (TOPAMAX) 100 MG tablet, Take 100 mg by mouth daily., Disp: , Rfl: 3   Trospium Chloride 60 MG CP24, Take 1 capsule (60 mg total) by mouth daily., Disp: 30 capsule, Rfl: 5   Objective Vitals:   01/31/23 1427  BP: (!) 149/88  Pulse: 75    Gen: NAD CV: S1 S2 RRR Lungs: Clear to auscultation bilaterally Abd: soft, nontender   Previous Pelvic Exam showed: Pelvic Exam: Normal external female genitalia; Bartholin's and Skene's glands normal in appearance; urethral meatus normal in appearance, no urethral masses or discharge.    CST: negative   s/p hysterectomy: Speculum exam reveals normal vaginal mucosa with  atrophy and normal vaginal cuff.  Adnexa no mass, fullness, tenderness.     Pelvic floor strength II/V   Pelvic floor musculature: Right levator non-tender, Right obturator non-tender, Left levator non-tender, Left obturator non-tender   POP-Q:    POP-Q   -2.5  Aa   -2.5                                           Ba   -5                                              C    4.5                                            Gh   2.5                                            Pb   6.5                                            tvl    0                                            Ap   0                                            Bp                                                  D          Rectal Exam:  Normal sphincter tone, small distal rectocele, enterocoele  not present, no rectal masses, no sign of dyssynergia when asking the patient to bear down.    Assessment/ Plan  Assessment: The patient is a 57 y.o. year old scheduled to undergo Exam under anesthesia, Posterior repair with Perineorrhapy, and possible sacrospinous fixation. Verbal consent was obtained for these procedures.  Plan: General Surgical Consent: The patient has previously been counseled on alternative treatments, and the decision by the patient and provider was to proceed with the procedure listed above.  For all procedures, there are risks of bleeding, infection, damage to surrounding organs including but not limited to bowel, bladder, blood vessels, ureters and nerves, and need for further surgery if an injury were to occur. These risks are all low with minimally invasive surgery.   There are risks of numbness and weakness at any body site or buttock/rectal pain.  It is possible that baseline pain can be worsened by surgery, either with or without mesh. If surgery is vaginal, there is also a low risk of possible conversion to laparoscopy or open abdominal incision where indicated. Very rare risks include blood transfusion, blood clot, heart attack, pneumonia, or death.  There is also a risk of short-term postoperative urinary retention with need to use a catheter. About half of patients need to go home from surgery with a catheter, which is then later removed in the office. The risk of long-term need for a catheter is very low. There is also a risk of worsening of overactive bladder.    Prolapse (with or without mesh): Risk factors for surgical failure  include things that put pressure on your pelvis and the surgical repair, including obesity, chronic cough, and heavy lifting or straining (including lifting children or adults, straining on the toilet, or lifting heavy objects such as furniture or anything weighing >25 lbs. Risks of recurrence is 20-30% with vaginal native tissue  repair and a less than 10% with sacrocolpopexy with mesh.    We discussed consent for blood products. Risks for blood transfusion include allergic reactions, other reactions that can affect different body organs and managed accordingly, transmission of infectious diseases such as HIV or Hepatitis. However, the blood is screened. Patient consents for blood products.  Pre-operative instructions:  She was instructed to not take Aspirin/NSAIDs x 7days prior to surgery. Antibiotic prophylaxis was ordered as indicated.  Catheter use: Patient will go home with foley if needed after post-operative voiding trial.  Post-operative instructions:  She was provided with specific post-operative instructions, including precautions and signs/symptoms for which we would recommend contacting us, in addition to daytime and after-hours contact phone numbers. This was provided on a handout.   Post-operative medications: Prescriptions for motrin, tylenol, miralax, and oxycodone were sent to her pharmacy. Discussed using ibuprofen and tylenol on a schedule to limit use of narcotics.   Laboratory testing:  We will check labs as requested by anesthesia.   Preoperative clearance:  She does not require surgical clearance.    Post-operative follow-up:  A post-operative appointment will be made for 6 weeks from the date of surgery. If she needs a post-operative nurse visit for a voiding trial, that will be set up after she leaves the hospital.    Patient will call the clinic or use MyChart should anything change or any new issues arise.   Selmer Dominion, NP

## 2023-02-08 ENCOUNTER — Ambulatory Visit
Admission: RE | Admit: 2023-02-08 | Discharge: 2023-02-08 | Disposition: A | Discharge status: Home / Self Care | Payer: BC Managed Care – PPO | Source: Ambulatory Visit | Attending: Physician Assistant | Admitting: Physician Assistant

## 2023-02-08 ENCOUNTER — Other Ambulatory Visit: Payer: Self-pay | Admitting: Physician Assistant

## 2023-02-08 DIAGNOSIS — M25562 Pain in left knee: Secondary | ICD-10-CM

## 2023-02-10 DIAGNOSIS — U071 COVID-19: Secondary | ICD-10-CM

## 2023-02-10 HISTORY — DX: COVID-19: U07.1

## 2023-02-18 ENCOUNTER — Encounter (HOSPITAL_BASED_OUTPATIENT_CLINIC_OR_DEPARTMENT_OTHER): Payer: Self-pay | Admitting: Obstetrics and Gynecology

## 2023-02-21 ENCOUNTER — Encounter (HOSPITAL_BASED_OUTPATIENT_CLINIC_OR_DEPARTMENT_OTHER): Payer: Self-pay | Admitting: Obstetrics and Gynecology

## 2023-02-21 NOTE — Progress Notes (Signed)
Spoke w/ via phone for pre-op interview--- pt Lab needs dos----  [employer], ekg           Lab results------ no COVID test -----patient states asymptomatic no test needed Arrive at ------- 1030 on 02-28-2023 NPO after MN NO Solid Food.  Clear liquids from MN until--- 0930 Med rec completed Medications to take morning of surgery ----- none Diabetic medication ----- n/a Patient instructed no nail polish to be worn day of surgery Patient instructed to bring photo id and insurance card day of surgery Patient aware to have Driver (ride ) / caregiver    for 24 hours after surgery -- husband, Leah Khan Patient Special Instructions ----- n/a Pre-Op special Instructions ----- n/a Patient verbalized understanding of instructions that were given at this phone interview. Patient denies shortness of breath, chest pain, fever, cough at this phone interview.

## 2023-02-24 ENCOUNTER — Telehealth: Payer: Self-pay | Admitting: Obstetrics and Gynecology

## 2023-02-24 NOTE — Progress Notes (Signed)
Received call from pt today.  She is surgery @[facility]  by Dr Florian Buff on 02-28-2023.  Pt stated she tested positive for COVID on Monday 02-21-2023, home test.  Symptoms were sore throat, cough, and really bad headache.  Stated she called her pcp office to covid medication.  Advised due to guidelines she will need to be rescheduled after 10 days from stated of positive test , which will be after 03-02-2023.  Pt is to call her surgeon's office and informed them of her positive covid test and reschedule.

## 2023-02-24 NOTE — Telephone Encounter (Signed)
Pt called in and said she tested positive for Covid on Monday and thinks she should probably reschedule her surgery.

## 2023-02-28 DIAGNOSIS — Z01818 Encounter for other preprocedural examination: Secondary | ICD-10-CM

## 2023-02-28 HISTORY — DX: Stress incontinence (female) (male): N39.3

## 2023-02-28 HISTORY — DX: Prediabetes: R73.03

## 2023-02-28 HISTORY — DX: Overactive bladder: N32.81

## 2023-03-29 ENCOUNTER — Encounter: Payer: BC Managed Care – PPO | Admitting: Obstetrics and Gynecology

## 2023-04-12 ENCOUNTER — Encounter: Payer: BC Managed Care – PPO | Admitting: Obstetrics and Gynecology

## 2023-04-25 ENCOUNTER — Encounter: Payer: Self-pay | Admitting: Obstetrics and Gynecology

## 2023-04-25 ENCOUNTER — Ambulatory Visit (INDEPENDENT_AMBULATORY_CARE_PROVIDER_SITE_OTHER): Payer: BC Managed Care – PPO | Admitting: Obstetrics and Gynecology

## 2023-04-25 VITALS — BP 110/72 | HR 85 | Ht 64.57 in | Wt 135.0 lb

## 2023-04-25 DIAGNOSIS — Z01818 Encounter for other preprocedural examination: Secondary | ICD-10-CM

## 2023-04-25 NOTE — H&P (Signed)
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   History of Present Illness: Leah Khan is a 57 y.o. female who presents for preoperative visit.  She is scheduled to undergo Exam under anesthesia, Posterior repair with Perineorrhapy, and possible sacrospinous fixation on 05/09/23.  Her symptoms include pelvic organ prolapse, and she was was found to have Stage I anterior, Stage II posterior, Stage I apical prolapse.   Urodynamics showed: Deferred  Past Medical History:  Diagnosis Date   ADD (attention deficit disorder)    Anxiety    Hypertension    Migraine    Prolapse of female pelvic organs      Past Surgical History:  Procedure Laterality Date   ABDOMINOPLASTY  02/2016   ANTERIOR CERVICAL DISCECTOMY  2021   per pt one level   BREAST REDUCTION SURGERY  1989   BUNIONECTOMY Right 1995   INCISIONAL HERNIA REPAIR  2018   SHOULDER ARTHROSCOPY WITH ROTATOR CUFF REPAIR AND SUBACROMIAL DECOMPRESSION Right 03/15/2018   Procedure: RIGHT SHOULDER ARTHROSCOPY WITH EXTENSIVE DEBRIDEMENT, ACROMIOPLASTY, ROTATOR CUFF REPAIR;  Surgeon: Tarry Kos, MD;  Location: [facility];  Service: Orthopedics;  Laterality: Right;   SHOULDER ARTHROSCOPY WITH SUBACROMIAL DECOMPRESSION Right 09/13/2018   Procedure: RIGHT SHOULDER ARTHROSCOPY WITH LYSIS OF ADHESIONS, SUBACROMIAL DECOMPRESSION AND MANIPULATION UNDER ANESTHESIA;  Surgeon: Tarry Kos, MD;  Location: [facility];  Service: Orthopedics;  Laterality: Right;   TONSILLECTOMY     child   TUBAL LIGATION Bilateral 1996   VAGINAL HYSTERECTOMY  1990    is allergic to ace inhibitors, amitriptyline, and sertraline hcl.   Family History  Problem Relation Age of Onset   Hypertension Mother    Diabetes Mother    Alcoholism Father    Diabetes Maternal Grandmother     Social History   Tobacco Use   Smoking status: Never   Smokeless tobacco:  Never  Vaping Use   Vaping status: Never Used  Substance Use Topics   Alcohol use: Not Currently    Comment: occationally   Drug use: Never     Review of Systems was negative for a full 10 system review except as noted in the History of Present Illness.  No current facility-administered medications for this encounter.  Current Outpatient Medications:    amLODipine (NORVASC) 5 MG tablet, Take 5 mg by mouth at bedtime. (Patient not taking: Reported on 04/25/2023), Disp: , Rfl:    estradiol (ESTRACE) 0.1 MG/GM vaginal cream, Place 0.5g nightly for two weeks then twice a week after (Patient taking differently: Place 1 Applicatorful vaginally 2 (two) times a week. Place 0.5g nightly for two weeks then twice a week after), Disp: 30 g, Rfl: 11   estradiol (ESTRACE) 1 MG tablet, Take 1 mg by mouth every 3 (three) days., Disp: , Rfl:    Multiple Vitamin (MULTIVITAMIN) tablet, Take 1 tablet by mouth at bedtime., Disp: , Rfl:    topiramate (TOPAMAX) 100 MG tablet, Take 100 mg by mouth at bedtime., Disp: , Rfl: 3   acetaminophen (TYLENOL) 500 MG tablet, Take 1 tablet (500 mg total) by mouth every 6 (six) hours as needed (pain)., Disp: 30 tablet, Rfl: 0   ibuprofen (ADVIL) 600 MG tablet, Take 1 tablet (600 mg total) by mouth every 6 (six) hours as needed. (Patient not taking: Reported on 04/25/2023), Disp: 30 tablet, Rfl: 0   LINZESS 72 MCG capsule, 1 capsule at least 30 minutes before the  first meal of the day on an empty stomach Orally Once a day for 30 days, Disp: , Rfl:    LORazepam (ATIVAN) 1 MG tablet, 1/2-1 tablet at bedtime as needed Orally Once a day, Disp: , Rfl:    oxyCODONE (OXY IR/ROXICODONE) 5 MG immediate release tablet, Take 1 tablet (5 mg total) by mouth every 4 (four) hours as needed for severe pain. (Patient not taking: Reported on 04/25/2023), Disp: 15 tablet, Rfl: 0   polyethylene glycol powder (GLYCOLAX/MIRALAX) 17 GM/SCOOP powder, Take 17 g by mouth daily. Drink 17g (1 scoop)  dissolved in water per day. (Patient not taking: Reported on 04/25/2023), Disp: 255 g, Rfl: 0   Objective There were no vitals filed for this visit.   Gen: NAD CV: S1 S2 RRR Lungs: Clear to auscultation bilaterally Abd: soft, nontender   Previous Pelvic Exam showed: Pelvic Exam: Normal external female genitalia; Bartholin's and Skene's glands normal in appearance; urethral meatus normal in appearance, no urethral masses or discharge.    CST: negative   s/p hysterectomy: Speculum exam reveals normal vaginal mucosa with  atrophy and normal vaginal cuff.  Adnexa no mass, fullness, tenderness.     Pelvic floor strength II/V   Pelvic floor musculature: Right levator non-tender, Right obturator non-tender, Left levator non-tender, Left obturator non-tender   POP-Q:    POP-Q   -2.5                                            Aa   -2.5                                           Ba   -5                                              C    4.5                                            Gh   2.5                                            Pb   6.5                                            tvl    0                                            Ap   0  Bp                                                  D          Rectal Exam:  Normal sphincter tone, small distal rectocele, enterocoele not present, no rectal masses, no sign of dyssynergia when asking the patient to bear down.    Assessment/ Plan  Assessment: The patient is a 57 y.o. year old scheduled to undergo Exam under anesthesia, Posterior repair with Perineorrhapy, and possible sacrospinous fixation. Verbal consent was obtained for these procedures.  Plan: General Surgical Consent: The patient has previously been counseled on alternative treatments, and the decision by the patient and provider was to proceed with the procedure listed above.  For all procedures, there are  risks of bleeding, infection, damage to surrounding organs including but not limited to bowel, bladder, blood vessels, ureters and nerves, and need for further surgery if an injury were to occur. These risks are all low with minimally invasive surgery.   There are risks of numbness and weakness at any body site or buttock/rectal pain.  It is possible that baseline pain can be worsened by surgery, either with or without mesh. If surgery is vaginal, there is also a low risk of possible conversion to laparoscopy or open abdominal incision where indicated. Very rare risks include blood transfusion, blood clot, heart attack, pneumonia, or death.   There is also a risk of short-term postoperative urinary retention with need to use a catheter. About half of patients need to go home from surgery with a catheter, which is then later removed in the office. The risk of long-term need for a catheter is very low. There is also a risk of worsening of overactive bladder.    Prolapse (with or without mesh): Risk factors for surgical failure  include things that put pressure on your pelvis and the surgical repair, including obesity, chronic cough, and heavy lifting or straining (including lifting children or adults, straining on the toilet, or lifting heavy objects such as furniture or anything weighing >25 lbs. Risks of recurrence is 20-30% with vaginal native tissue repair and a less than 10% with sacrocolpopexy with mesh.    We discussed consent for blood products. Risks for blood transfusion include allergic reactions, other reactions that can affect different body organs and managed accordingly, transmission of infectious diseases such as HIV or Hepatitis. However, the blood is screened. Patient consents for blood products.  Pre-operative instructions:  She was instructed to not take Aspirin/NSAIDs x 7days prior to surgery. Antibiotic prophylaxis was ordered as indicated.  Catheter use: Patient will go home with  foley if needed after post-operative voiding trial.  Post-operative instructions:  She was provided with specific post-operative instructions, including precautions and signs/symptoms for which we would recommend contacting us, in addition to daytime and after-hours contact phone numbers. This was provided on a handout.   Post-operative medications: Prescriptions for motrin, tylenol, miralax, and oxycodone were sent at her last pre-op and she reports she did pick them up, so no further medications sent in today.   Laboratory testing:  We will check labs as requested by anesthesia.   Preoperative clearance:  She does not require surgical clearance.    Post-operative follow-up:  A post-operative appointment will be made for 6 weeks from the date of surgery. If she  needs a post-operative nurse visit for a voiding trial, that will be set up after she leaves the hospital.    Patient will call the clinic or use MyChart should anything change or any new issues arise.   Selmer Dominion, NP

## 2023-04-25 NOTE — Progress Notes (Signed)
[name] Pre-Operative Exam  Subjective Chief Complaint: Leah Khan presents for a preoperative encounter.   History of Present Illness: Leah Khan is a 57 y.o. female who presents for preoperative visit.  She is scheduled to undergo Exam under anesthesia, Posterior repair with Perineorrhapy, and possible sacrospinous fixation on 05/09/23.  Her symptoms include pelvic organ prolapse, and she was was found to have Stage I anterior, Stage II posterior, Stage I apical prolapse.   Urodynamics showed: Deferred  Past Medical History:  Diagnosis Date   ADD (attention deficit disorder)    Anxiety    Hypertension    Migraine    Prolapse of female pelvic organs      Past Surgical History:  Procedure Laterality Date   ABDOMINOPLASTY  02/2016   ANTERIOR CERVICAL DISCECTOMY  2021   per pt one level   BREAST REDUCTION SURGERY  1989   BUNIONECTOMY Right 1995   INCISIONAL HERNIA REPAIR  2018   SHOULDER ARTHROSCOPY WITH ROTATOR CUFF REPAIR AND SUBACROMIAL DECOMPRESSION Right 03/15/2018   Procedure: RIGHT SHOULDER ARTHROSCOPY WITH EXTENSIVE DEBRIDEMENT, ACROMIOPLASTY, ROTATOR CUFF REPAIR;  Surgeon: Tarry Kos, MD;  Location: [facility];  Service: Orthopedics;  Laterality: Right;   SHOULDER ARTHROSCOPY WITH SUBACROMIAL DECOMPRESSION Right 09/13/2018   Procedure: RIGHT SHOULDER ARTHROSCOPY WITH LYSIS OF ADHESIONS, SUBACROMIAL DECOMPRESSION AND MANIPULATION UNDER ANESTHESIA;  Surgeon: Tarry Kos, MD;  Location: [facility];  Service: Orthopedics;  Laterality: Right;   TONSILLECTOMY     child   TUBAL LIGATION Bilateral 1996   VAGINAL HYSTERECTOMY  1990    is allergic to ace inhibitors, amitriptyline, and sertraline hcl.   Family History  Problem Relation Age of Onset   Hypertension Mother    Diabetes Mother    Alcoholism Father    Diabetes Maternal Grandmother     Social History   Tobacco Use   Smoking status: Never    Smokeless tobacco: Never  Vaping Use   Vaping status: Never Used  Substance Use Topics   Alcohol use: Not Currently    Comment: occationally   Drug use: Never     Review of Systems was negative for a full 10 system review except as noted in the History of Present Illness.   Current Outpatient Medications:    acetaminophen (TYLENOL) 500 MG tablet, Take 1 tablet (500 mg total) by mouth every 6 (six) hours as needed (pain)., Disp: 30 tablet, Rfl: 0   estradiol (ESTRACE) 0.1 MG/GM vaginal cream, Place 0.5g nightly for two weeks then twice a week after (Patient taking differently: Place 1 Applicatorful vaginally 2 (two) times a week. Place 0.5g nightly for two weeks then twice a week after), Disp: 30 g, Rfl: 11   estradiol (ESTRACE) 1 MG tablet, Take 1 mg by mouth every 3 (three) days., Disp: , Rfl:    LINZESS 72 MCG capsule, 1 capsule at least 30 minutes before the first meal of the day on an empty stomach Orally Once a day for 30 days, Disp: , Rfl:    LORazepam (ATIVAN) 1 MG tablet, 1/2-1 tablet at bedtime as needed Orally Once a day, Disp: , Rfl:    Multiple Vitamin (MULTIVITAMIN) tablet, Take 1 tablet by mouth at bedtime., Disp: , Rfl:    topiramate (TOPAMAX) 100 MG tablet, Take 100 mg by mouth at bedtime., Disp: , Rfl: 3   amLODipine (NORVASC) 5 MG tablet, Take 5 mg by mouth at bedtime. (Patient not taking: Reported on 04/25/2023), Disp: ,  Rfl:    ibuprofen (ADVIL) 600 MG tablet, Take 1 tablet (600 mg total) by mouth every 6 (six) hours as needed. (Patient not taking: Reported on 04/25/2023), Disp: 30 tablet, Rfl: 0   oxyCODONE (OXY IR/ROXICODONE) 5 MG immediate release tablet, Take 1 tablet (5 mg total) by mouth every 4 (four) hours as needed for severe pain. (Patient not taking: Reported on 04/25/2023), Disp: 15 tablet, Rfl: 0   polyethylene glycol powder (GLYCOLAX/MIRALAX) 17 GM/SCOOP powder, Take 17 g by mouth daily. Drink 17g (1 scoop) dissolved in water per day. (Patient not taking:  Reported on 04/25/2023), Disp: 255 g, Rfl: 0   Objective Vitals:   04/25/23 0921  BP: 110/72  Pulse: 85    Gen: NAD CV: S1 S2 RRR Lungs: Clear to auscultation bilaterally Abd: soft, nontender   Previous Pelvic Exam showed: Pelvic Exam: Normal external female genitalia; Bartholin's and Skene's glands normal in appearance; urethral meatus normal in appearance, no urethral masses or discharge.    CST: negative   s/p hysterectomy: Speculum exam reveals normal vaginal mucosa with  atrophy and normal vaginal cuff.  Adnexa no mass, fullness, tenderness.     Pelvic floor strength II/V   Pelvic floor musculature: Right levator non-tender, Right obturator non-tender, Left levator non-tender, Left obturator non-tender   POP-Q:    POP-Q   -2.5                                            Aa   -2.5                                           Ba   -5                                              C    4.5                                            Gh   2.5                                            Pb   6.5                                            tvl    0                                            Ap   0  Bp                                                  D          Rectal Exam:  Normal sphincter tone, small distal rectocele, enterocoele not present, no rectal masses, no sign of dyssynergia when asking the patient to bear down.    Assessment/ Plan  Assessment: The patient is a 57 y.o. year old scheduled to undergo Exam under anesthesia, Posterior repair with Perineorrhapy, and possible sacrospinous fixation. Verbal consent was obtained for these procedures.  Plan: General Surgical Consent: The patient has previously been counseled on alternative treatments, and the decision by the patient and provider was to proceed with the procedure listed above.  For all procedures, there are risks of bleeding, infection, damage to  surrounding organs including but not limited to bowel, bladder, blood vessels, ureters and nerves, and need for further surgery if an injury were to occur. These risks are all low with minimally invasive surgery.   There are risks of numbness and weakness at any body site or buttock/rectal pain.  It is possible that baseline pain can be worsened by surgery, either with or without mesh. If surgery is vaginal, there is also a low risk of possible conversion to laparoscopy or open abdominal incision where indicated. Very rare risks include blood transfusion, blood clot, heart attack, pneumonia, or death.   There is also a risk of short-term postoperative urinary retention with need to use a catheter. About half of patients need to go home from surgery with a catheter, which is then later removed in the office. The risk of long-term need for a catheter is very low. There is also a risk of worsening of overactive bladder.    Prolapse (with or without mesh): Risk factors for surgical failure  include things that put pressure on your pelvis and the surgical repair, including obesity, chronic cough, and heavy lifting or straining (including lifting children or adults, straining on the toilet, or lifting heavy objects such as furniture or anything weighing >25 lbs. Risks of recurrence is 20-30% with vaginal native tissue repair and a less than 10% with sacrocolpopexy with mesh.    We discussed consent for blood products. Risks for blood transfusion include allergic reactions, other reactions that can affect different body organs and managed accordingly, transmission of infectious diseases such as HIV or Hepatitis. However, the blood is screened. Patient consents for blood products.  Pre-operative instructions:  She was instructed to not take Aspirin/NSAIDs x 7days prior to surgery. Antibiotic prophylaxis was ordered as indicated.  Catheter use: Patient will go home with foley if needed after post-operative  voiding trial.  Post-operative instructions:  She was provided with specific post-operative instructions, including precautions and signs/symptoms for which we would recommend contacting us, in addition to daytime and after-hours contact phone numbers. This was provided on a handout.   Post-operative medications: Prescriptions for motrin, tylenol, miralax, and oxycodone were sent at her last pre-op and she reports she did pick them up, so no further medications sent in today.   Laboratory testing:  We will check labs as requested by anesthesia.   Preoperative clearance:  She does not require surgical clearance.    Post-operative follow-up:  A post-operative appointment will be made for 6 weeks from the date of surgery. If she  needs a post-operative nurse visit for a voiding trial, that will be set up after she leaves the hospital.    Patient will call the clinic or use MyChart should anything change or any new issues arise.   Selmer Dominion, NP

## 2023-04-28 ENCOUNTER — Encounter (HOSPITAL_BASED_OUTPATIENT_CLINIC_OR_DEPARTMENT_OTHER): Payer: Self-pay | Admitting: Obstetrics and Gynecology

## 2023-04-28 NOTE — Progress Notes (Signed)
Spoke w/ via phone for pre-op interview--- Leah Khan Lab needs dos----  ISTAT and EKG per anesthesia       Lab results------ COVID test -----patient states asymptomatic no test needed Arrive at -------1130 NPO after MN NO Solid Food.  Clear liquids from MN until---1030 Med rec completed Medications to take morning of surgery -----NONE Diabetic medication ----- Patient instructed no nail polish to be worn day of surgery Patient instructed to bring photo id and insurance card day of surgery Patient aware to have Driver (ride ) / caregiver    for 24 hours after surgery - Doretha Sou Patient Special Instructions ----- Pre-Op special Instructions ----- Patient verbalized understanding of instructions that were given at this phone interview. Patient denies chest pain, sob, fever, cough at the interview.

## 2023-05-09 ENCOUNTER — Encounter (HOSPITAL_BASED_OUTPATIENT_CLINIC_OR_DEPARTMENT_OTHER): Payer: Self-pay | Admitting: Obstetrics and Gynecology

## 2023-05-09 ENCOUNTER — Encounter (HOSPITAL_BASED_OUTPATIENT_CLINIC_OR_DEPARTMENT_OTHER)
Admission: RE | Disposition: A | Discharge status: Home / Self Care | Payer: Self-pay | Source: Home / Self Care | Attending: Obstetrics and Gynecology

## 2023-05-09 ENCOUNTER — Ambulatory Visit (HOSPITAL_BASED_OUTPATIENT_CLINIC_OR_DEPARTMENT_OTHER): Payer: Self-pay | Admitting: Anesthesiology

## 2023-05-09 ENCOUNTER — Ambulatory Visit (HOSPITAL_BASED_OUTPATIENT_CLINIC_OR_DEPARTMENT_OTHER): Payer: BC Managed Care – PPO | Admitting: Anesthesiology

## 2023-05-09 ENCOUNTER — Ambulatory Visit (HOSPITAL_BASED_OUTPATIENT_CLINIC_OR_DEPARTMENT_OTHER)
Admission: RE | Admit: 2023-05-09 | Discharge: 2023-05-09 | Disposition: A | Discharge status: Home / Self Care | Payer: BC Managed Care – PPO | Attending: Obstetrics and Gynecology | Admitting: Obstetrics and Gynecology

## 2023-05-09 ENCOUNTER — Other Ambulatory Visit: Payer: Self-pay

## 2023-05-09 DIAGNOSIS — Z01818 Encounter for other preprocedural examination: Secondary | ICD-10-CM

## 2023-05-09 DIAGNOSIS — N993 Prolapse of vaginal vault after hysterectomy: Secondary | ICD-10-CM | POA: Diagnosis present

## 2023-05-09 HISTORY — DX: Other specified behavioral and emotional disorders with onset usually occurring in childhood and adolescence: F98.8

## 2023-05-09 HISTORY — DX: Anxiety disorder, unspecified: F41.9

## 2023-05-09 HISTORY — DX: Female genital prolapse, unspecified: N81.9

## 2023-05-09 HISTORY — PX: ANTERIOR AND POSTERIOR REPAIR WITH SACROSPINOUS FIXATION: SHX6536

## 2023-05-09 LAB — POCT I-STAT, CHEM 8
BUN: 12 mg/dL (ref 6–20)
Calcium, Ion: 1.14 mmol/L — ABNORMAL LOW (ref 1.15–1.40)
Chloride: 108 mmol/L (ref 98–111)
Creatinine, Ser: 0.9 mg/dL (ref 0.44–1.00)
Glucose, Bld: 83 mg/dL (ref 70–99)
HCT: 43 % (ref 36.0–46.0)
Hemoglobin: 14.6 g/dL (ref 12.0–15.0)
Potassium: 3.4 mmol/L — ABNORMAL LOW (ref 3.5–5.1)
Sodium: 141 mmol/L (ref 135–145)
TCO2: 21 mmol/L — ABNORMAL LOW (ref 22–32)

## 2023-05-09 SURGERY — ANTERIOR AND POSTERIOR REPAIR WITH SACROSPINOUS FIXATION
Anesthesia: General | Site: Vagina

## 2023-05-09 MED ORDER — DEXAMETHASONE SODIUM PHOSPHATE 10 MG/ML IJ SOLN
INTRAMUSCULAR | Status: DC | PRN
  Administered 2023-05-09: 10 mg via INTRAVENOUS

## 2023-05-09 MED ORDER — SCOPOLAMINE 1 MG/3DAYS TD PT72
1.0000 | MEDICATED_PATCH | TRANSDERMAL | Status: DC
  Administered 2023-05-09: 1.5 mg via TRANSDERMAL

## 2023-05-09 MED ORDER — 0.9 % SODIUM CHLORIDE (POUR BTL) OPTIME
TOPICAL | Status: DC | PRN
  Administered 2023-05-09: 1000 mL

## 2023-05-09 MED ORDER — GABAPENTIN 300 MG PO CAPS
300.0000 mg | ORAL_CAPSULE | ORAL | Status: AC
  Administered 2023-05-09: 300 mg via ORAL

## 2023-05-09 MED ORDER — GABAPENTIN 300 MG PO CAPS
ORAL_CAPSULE | ORAL | Status: AC
  Filled 2023-05-09: qty 1

## 2023-05-09 MED ORDER — LIDOCAINE HCL (PF) 2 % IJ SOLN
INTRAMUSCULAR | Status: AC
  Filled 2023-05-09: qty 5

## 2023-05-09 MED ORDER — ONDANSETRON HCL 4 MG/2ML IJ SOLN
INTRAMUSCULAR | Status: DC | PRN
  Administered 2023-05-09: 4 mg via INTRAVENOUS

## 2023-05-09 MED ORDER — DIPHENHYDRAMINE HCL 50 MG/ML IJ SOLN
INTRAMUSCULAR | Status: DC | PRN
  Administered 2023-05-09: 12.5 mg via INTRAVENOUS

## 2023-05-09 MED ORDER — CEFAZOLIN SODIUM-DEXTROSE 2-4 GM/100ML-% IV SOLN
INTRAVENOUS | Status: AC
  Filled 2023-05-09: qty 100

## 2023-05-09 MED ORDER — OXYCODONE HCL 5 MG PO TABS
5.0000 mg | ORAL_TABLET | Freq: Once | ORAL | Status: AC | PRN
  Administered 2023-05-09: 5 mg via ORAL

## 2023-05-09 MED ORDER — ONDANSETRON HCL 4 MG/2ML IJ SOLN
INTRAMUSCULAR | Status: AC
  Filled 2023-05-09: qty 2

## 2023-05-09 MED ORDER — MIDAZOLAM HCL 5 MG/5ML IJ SOLN
INTRAMUSCULAR | Status: DC | PRN
  Administered 2023-05-09: 2 mg via INTRAVENOUS

## 2023-05-09 MED ORDER — PHENYLEPHRINE 80 MCG/ML (10ML) SYRINGE FOR IV PUSH (FOR BLOOD PRESSURE SUPPORT)
PREFILLED_SYRINGE | INTRAVENOUS | Status: AC
  Filled 2023-05-09: qty 20

## 2023-05-09 MED ORDER — FENTANYL CITRATE (PF) 100 MCG/2ML IJ SOLN: 25.0000 ug | INTRAMUSCULAR | Status: DC | PRN

## 2023-05-09 MED ORDER — KETOROLAC TROMETHAMINE 30 MG/ML IJ SOLN
INTRAMUSCULAR | Status: AC
  Filled 2023-05-09: qty 1

## 2023-05-09 MED ORDER — MIDAZOLAM HCL 2 MG/2ML IJ SOLN
INTRAMUSCULAR | Status: AC
  Filled 2023-05-09: qty 2

## 2023-05-09 MED ORDER — KETOROLAC TROMETHAMINE 30 MG/ML IJ SOLN
INTRAMUSCULAR | Status: DC | PRN
  Administered 2023-05-09: 30 mg via INTRAVENOUS

## 2023-05-09 MED ORDER — FENTANYL CITRATE (PF) 100 MCG/2ML IJ SOLN
INTRAMUSCULAR | Status: AC
  Filled 2023-05-09: qty 2

## 2023-05-09 MED ORDER — LIDOCAINE-EPINEPHRINE 1 %-1:100000 IJ SOLN
INTRAMUSCULAR | Status: DC | PRN
  Administered 2023-05-09: 12 mL

## 2023-05-09 MED ORDER — ACETAMINOPHEN 500 MG PO TABS
ORAL_TABLET | ORAL | Status: AC
  Filled 2023-05-09: qty 2

## 2023-05-09 MED ORDER — CEFAZOLIN SODIUM-DEXTROSE 2-4 GM/100ML-% IV SOLN
2.0000 g | INTRAVENOUS | Status: AC
  Administered 2023-05-09: 2 g via INTRAVENOUS

## 2023-05-09 MED ORDER — ACETAMINOPHEN 500 MG PO TABS
1000.0000 mg | ORAL_TABLET | ORAL | Status: AC
  Administered 2023-05-09: 1000 mg via ORAL

## 2023-05-09 MED ORDER — LACTATED RINGERS IV SOLN: INTRAVENOUS | Status: DC

## 2023-05-09 MED ORDER — PHENYLEPHRINE 80 MCG/ML (10ML) SYRINGE FOR IV PUSH (FOR BLOOD PRESSURE SUPPORT)
PREFILLED_SYRINGE | INTRAVENOUS | Status: DC | PRN
  Administered 2023-05-09 (×3): 80 ug via INTRAVENOUS

## 2023-05-09 MED ORDER — AMISULPRIDE (ANTIEMETIC) 5 MG/2ML IV SOLN: 10.0000 mg | Freq: Once | INTRAVENOUS | Status: DC | PRN

## 2023-05-09 MED ORDER — PROPOFOL 10 MG/ML IV BOLUS
INTRAVENOUS | Status: DC | PRN
  Administered 2023-05-09: 150 mg via INTRAVENOUS

## 2023-05-09 MED ORDER — GLYCOPYRROLATE 0.2 MG/ML IJ SOLN
INTRAMUSCULAR | Status: DC | PRN
  Administered 2023-05-09: .2 mg via INTRAVENOUS

## 2023-05-09 MED ORDER — ARTIFICIAL TEARS OPHTHALMIC OINT
TOPICAL_OINTMENT | OPHTHALMIC | Status: AC
  Filled 2023-05-09: qty 3.5

## 2023-05-09 MED ORDER — FENTANYL CITRATE (PF) 100 MCG/2ML IJ SOLN
INTRAMUSCULAR | Status: DC | PRN
  Administered 2023-05-09: 50 ug via INTRAVENOUS

## 2023-05-09 MED ORDER — SCOPOLAMINE 1 MG/3DAYS TD PT72
MEDICATED_PATCH | TRANSDERMAL | Status: AC
  Filled 2023-05-09: qty 1

## 2023-05-09 MED ORDER — LIDOCAINE 2% (20 MG/ML) 5 ML SYRINGE
INTRAMUSCULAR | Status: DC | PRN
  Administered 2023-05-09: 60 mg via INTRAVENOUS

## 2023-05-09 MED ORDER — OXYCODONE HCL 5 MG PO TABS
ORAL_TABLET | ORAL | Status: AC
  Filled 2023-05-09: qty 1

## 2023-05-09 MED ORDER — OXYCODONE HCL 5 MG/5ML PO SOLN: 5.0000 mg | Freq: Once | ORAL | Status: AC | PRN

## 2023-05-09 MED ORDER — EPHEDRINE 5 MG/ML INJ
INTRAVENOUS | Status: AC
  Filled 2023-05-09: qty 5

## 2023-05-09 MED ORDER — PROPOFOL 10 MG/ML IV BOLUS
INTRAVENOUS | Status: AC
  Filled 2023-05-09: qty 20

## 2023-05-09 SURGICAL SUPPLY — 36 items
AGENT HMST KT MTR STRL THRMB (HEMOSTASIS)
BLADE CLIPPER SENSICLIP SURGIC (BLADE) IMPLANT
BLADE SURG 15 STRL LF DISP TIS (BLADE) ×1 IMPLANT
BLADE SURG 15 STRL SS (BLADE) ×1
DEVICE CAPIO SLIM SINGLE (INSTRUMENTS) IMPLANT
DEVICE CAPIO SUTURING OPC (INSTRUMENTS) IMPLANT
GAUZE 4X4 16PLY ~~LOC~~+RFID DBL (SPONGE) IMPLANT
GLOVE BIOGEL PI IND STRL 6.5 (GLOVE) ×1 IMPLANT
GLOVE BIOGEL PI IND STRL 7.0 (GLOVE) ×1 IMPLANT
GLOVE ECLIPSE 6.0 STRL STRAW (GLOVE) ×1 IMPLANT
GOWN STRL REUS W/TWL LRG LVL3 (GOWN DISPOSABLE) ×1 IMPLANT
HOLDER FOLEY CATH W/STRAP (MISCELLANEOUS) ×1 IMPLANT
KIT TURNOVER CYSTO (KITS) ×1 IMPLANT
NDL HYPO 22X1.5 SAFETY MO (MISCELLANEOUS) ×1 IMPLANT
NDL MAYO 6 CRC TAPER PT (NEEDLE) IMPLANT
NEEDLE HYPO 22X1.5 SAFETY MO (MISCELLANEOUS) ×2 IMPLANT
NEEDLE MAYO 6 CRC TAPER PT (NEEDLE) ×1 IMPLANT
NS IRRIG 1000ML POUR BTL (IV SOLUTION) ×1 IMPLANT
PACK VAGINAL WOMENS (CUSTOM PROCEDURE TRAY) ×1 IMPLANT
PAD OB MATERNITY 4.3X12.25 (PERSONAL CARE ITEMS) ×1 IMPLANT
RETRACTOR LONE STAR DISPOSABLE (INSTRUMENTS) ×1 IMPLANT
RETRACTOR STAY HOOK 5MM (MISCELLANEOUS) ×1 IMPLANT
SCRUB CHG 4% DYNA-HEX 4OZ (MISCELLANEOUS) ×1 IMPLANT
SET IRRIG Y TYPE TUR BLADDER L (SET/KITS/TRAYS/PACK) ×1 IMPLANT
SLEEVE SCD COMPRESS KNEE MED (STOCKING) ×1 IMPLANT
SPIKE FLUID TRANSFER (MISCELLANEOUS) IMPLANT
SURGIFLO W/THROMBIN 8M KIT (HEMOSTASIS) IMPLANT
SUT ABS MONO DBL WITH NDL 48IN (SUTURE) IMPLANT
SUT VIC AB 0 CT1 27 (SUTURE)
SUT VIC AB 0 CT1 27XBRD ANBCTR (SUTURE) IMPLANT
SUT VIC AB 2-0 SH 27 (SUTURE)
SUT VIC AB 2-0 SH 27XBRD (SUTURE) IMPLANT
SUT VICRYL 2-0 SH 8X27 (SUTURE) ×1 IMPLANT
SYR BULB EAR ULCER 3OZ GRN STR (SYRINGE) ×1 IMPLANT
TOWEL OR 17X24 6PK STRL BLUE (TOWEL DISPOSABLE) ×1 IMPLANT
TRAY FOLEY W/BAG SLVR 14FR LF (SET/KITS/TRAYS/PACK) ×1 IMPLANT

## 2023-05-09 NOTE — Anesthesia Postprocedure Evaluation (Signed)
Anesthesia Post Note  Patient: Leah Khan  Procedure(s) Performed: POSTERIOR REPAIR WITH PERINEORRHAPY AND SACROSPINOUS FIXATION (Vagina )     Patient location during evaluation: PACU Anesthesia Type: General Level of consciousness: awake Pain management: pain level controlled Vital Signs Assessment: post-procedure vital signs reviewed and stable Respiratory status: spontaneous breathing, nonlabored ventilation and respiratory function stable Cardiovascular status: blood pressure returned to baseline and stable Postop Assessment: no apparent nausea or vomiting Anesthetic complications: no   No notable events documented.  Last Vitals:  Vitals:   05/09/23 1230 05/09/23 1245  BP: 111/76 115/76  Pulse: 74 70  Resp: 10 10  Temp:    SpO2: 100% 100%    Last Pain:  Vitals:   05/09/23 1245  TempSrc:   PainSc: 0-No pain                 Linton Rump

## 2023-05-09 NOTE — Progress Notes (Signed)
Very sleepy at DC time, pt states I want to go home and go to bed, denies any pain or discomfort at this time, VS assessed, WNL, was able to void, but does require assistance when ambulating to restroom, able to tolerate PO fluids and crackers without issue. Pt escorted by PACU Staff to POV, pt assisted to exit via wc. Opportunity for questions provided prior to DC in POV and during AVS review.

## 2023-05-09 NOTE — Plan of Care (Signed)
 [facility] Tonsillectomy/Adenoidectomy, Postoperative PEDS care plan entered in error.

## 2023-05-09 NOTE — Discharge Instructions (Addendum)

## 2023-05-09 NOTE — Anesthesia Procedure Notes (Signed)
Procedure Name: LMA Insertion Date/Time: 05/09/2023 11:14 AM  Performed by: Bishop Limbo, CRNAPre-anesthesia Checklist: Patient identified, Emergency Drugs available, Suction available and Patient being monitored Patient Re-evaluated:Patient Re-evaluated prior to induction Oxygen Delivery Method: Circle System Utilized Preoxygenation: Pre-oxygenation with 100% oxygen Induction Type: IV induction Ventilation: Mask ventilation without difficulty LMA: LMA inserted LMA Size: 4.0 Number of attempts: 1 Placement Confirmation: positive ETCO2 Tube secured with: Tape Dental Injury: Teeth and Oropharynx as per pre-operative assessment

## 2023-05-09 NOTE — Op Note (Signed)
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 She was placed in dorsal lithotomy position, taking care to avoid any traction on the extremities, and then prepped and draped in the usual sterile fashion. A self-retaining lonestar retractor was placed using four elastic blue stays.  After a foley catheter was inserted into the urethra, the location of the midurethra was palpated. Two Allis clamps were along the posterior vaginal wall defect. 1% lidocaine with epinephrine was injected into the vaginal mucosa.  A vertical incision was made between these two Allis clamps with a 15 blade scalpel.  Allis clamps were placed along this incision and Metzenbaum scissors were used to undermine the vaginal mucosa along the incision.  The vaginal mucosa was then sharply dissected off of the rectovaginal septum.    For the sacrospinous ligament fixation (SSLF), the ischial spine was accessed on the right side via dissection with Mayo scissors and blunt dissection.  The sacrospinous ligament was palpated. Two 0 PDS suture was then placed at the sacrospinous ligament two fingerbreadths medial to the ischial spine, in order to avoid the pudendal neurovascular bundle, using a Capio needle driver.  The PDS suture was attached to the vaginal epithelium on the ipsilateral side of the  vaginal apex and held. Reduction of the rectocele was performed using a pursestring and mattress sutures of 2-0 Vicryl. The vaginal mucosal edges were trimmed and the incision reapproximated with 2-0 Vicryl in a running fashion. The SSLF suture was then tied down with excellent support of the posterior and apical vagina.  Attention was then turned to the perineum. Two allis clamps were placed at the introitus. The perineum was injected with 1% lidocaine with epinephrine. A diamond shaped incision was made over the perineum and excess skin was removed. Dissection was performed with Metzenbaum scissors to separate the mucosa from the underlying tissue. The perineal body was then reapproximated with two interrupted 0-vicryl sutures. The perineal skin was then closed with a 2-0 vicryl in a subcutaneous and subcuticular fashion. Good hemostasis was noted.  The vagina was copiously irrigated.  Hemostasis was noted.  Vaginal packing was not placed.  A rectal examination was normal and confirmed no sutures within the rectum. The patient tolerated the procedure well and was taken to the recovery room in stable condition. Needle and sponge counts were correct x2.   Leah Beards, MD

## 2023-05-09 NOTE — Anesthesia Preprocedure Evaluation (Addendum)
Anesthesia Evaluation  Patient identified by MRN, date of birth, ID band Patient awake    Reviewed: Allergy & Precautions, NPO status , Patient's Chart, lab work & pertinent test results  History of Anesthesia Complications Negative for: history of anesthetic complications  Airway Mallampati: II  TM Distance: >3 FB Neck ROM: Full   Comment: Overbite, class 1 bite test  Previous grade II view with MAC 3, easy mask   Dental  (+) Dental Advisory Given   Pulmonary neg pulmonary ROS   Pulmonary exam normal breath sounds clear to auscultation       Cardiovascular hypertension (no longer taking medications), (-) angina (-) Past MI, (-) Cardiac Stents and (-) CABG (-) dysrhythmias  Rhythm:Regular Rate:Normal     Neuro/Psych  Headaches PSYCHIATRIC DISORDERS (ADD) Anxiety        GI/Hepatic negative GI ROS, Neg liver ROS,,,  Endo/Other  negative endocrine ROS    Renal/GU negative Renal ROS     Musculoskeletal  (+) Arthritis ,    Abdominal   Peds  Hematology negative hematology ROS (+)             Anesthesia Other Findings   Reproductive/Obstetrics                             Anesthesia Physical Anesthesia Plan  ASA: 2  Anesthesia Plan: General   Post-op Pain Management: Tylenol PO (pre-op)*   Induction: Intravenous  PONV Risk Score and Plan: 3 and Ondansetron, Dexamethasone, Midazolam, Scopolamine patch - Pre-op and Treatment may vary due to age or medical condition  Airway Management Planned: Oral ETT  Additional Equipment:   Intra-op Plan:   Post-operative Plan: Extubation in OR  Informed Consent: I have reviewed the patients History and Physical, chart, labs and discussed the procedure including the risks, benefits and alternatives for the proposed anesthesia with the patient or authorized representative who has indicated his/her understanding and acceptance.     Dental  advisory given  Plan Discussed with: CRNA and Anesthesiologist  Anesthesia Plan Comments: (Risks of general anesthesia discussed including, but not limited to, sore throat, hoarse voice, chipped/damaged teeth, injury to vocal cords, nausea and vomiting, allergic reactions, lung infection, heart attack, stroke, and death. All questions answered. )        Anesthesia Quick Evaluation

## 2023-05-09 NOTE — Interval H&P Note (Signed)
History and Physical Interval Note:  05/09/2023 10:38 AM  Leah Khan  has presented today for surgery, with the diagnosis of posterior vaginal prolapse;Marland Kitchen  The various methods of treatment have been discussed with the patient and family. After consideration of risks, benefits and other options for treatment, the patient has consented to  Procedure(s) with comments: POSTERIOR REPAIR WITH PERINEORRHAPY AND POSSIBLE SACROSPINOUS FIXATION (N/A) as a surgical intervention.  The patient's history has been reviewed, patient examined, no change in status, stable for surgery.  I have reviewed the patient's chart and labs.  Questions were answered to the patient's satisfaction.     Marguerita Beards

## 2023-05-09 NOTE — Transfer of Care (Signed)
Immediate Anesthesia Transfer of Care Note  Patient: Leah Khan  Procedure(s) Performed: POSTERIOR REPAIR WITH PERINEORRHAPY AND SACROSPINOUS FIXATION (Vagina )  Patient Location: PACU  Anesthesia Type:General  Level of Consciousness: drowsy and responds to stimulation  Airway & Oxygen Therapy: Patient Spontanous Breathing and Patient connected to face mask oxygen  Post-op Assessment: Report given to RN and Post -op Vital signs reviewed and stable  Post vital signs: Reviewed and stable  Last Vitals:  Vitals Value Taken Time  BP 113/78 05/09/23 1228  Temp    Pulse 74 05/09/23 1229  Resp 10 05/09/23 1229  SpO2 100 % 05/09/23 1229  Vitals shown include unfiled device data.  Last Pain:  Vitals:   05/09/23 0908  TempSrc: Oral      Patients Stated Pain Goal: 5 (05/09/23 0925)  Complications: No notable events documented.

## 2023-05-10 ENCOUNTER — Telehealth: Payer: Self-pay | Admitting: Obstetrics and Gynecology

## 2023-05-10 DIAGNOSIS — G8918 Other acute postprocedural pain: Secondary | ICD-10-CM

## 2023-05-10 MED ORDER — GABAPENTIN 100 MG PO CAPS: 100.0000 mg | ORAL_CAPSULE | Freq: Three times a day (TID) | ORAL | 3 refills | Status: AC

## 2023-05-10 MED ORDER — OXYCODONE HCL 5 MG PO TABS: 5.0000 mg | ORAL_TABLET | ORAL | 0 refills | Status: DC | PRN

## 2023-05-10 NOTE — Telephone Encounter (Signed)
Leah Khan underwent Posterior repair, perineorrhaphy, sacrospinous ligament fixation on 05/10/23.   Did not have a formal voiding trial but was able to void before discharge. She was discharged without a catheter. Please call her for a routine post op check. Thanks!  Marguerita Beards, MD

## 2023-05-10 NOTE — Telephone Encounter (Signed)
Spoke with patient. She is taking the pain medication as prescribed- ibuprofen, tylenol and oxycodone. She is feeling sharp pain in the perineal area and in the buttock. She has been taking the oxycodone every 4 hours and it does help but not enough to take away the sharpness. Recommended increasing the oxycodone to 10mg - additional 15 pills sent. Also will add gabapentin 100mg  TID. If pain does not improve, then she should call the office so we can schedule an exam.   Marguerita Beards, MD

## 2023-05-11 ENCOUNTER — Encounter (HOSPITAL_BASED_OUTPATIENT_CLINIC_OR_DEPARTMENT_OTHER): Payer: Self-pay | Admitting: Obstetrics and Gynecology

## 2023-05-19 ENCOUNTER — Other Ambulatory Visit: Payer: Self-pay | Admitting: Obstetrics and Gynecology

## 2023-05-19 DIAGNOSIS — G8918 Other acute postprocedural pain: Secondary | ICD-10-CM

## 2023-05-19 MED ORDER — LIDOCAINE-PRILOCAINE 2.5-2.5 % EX CREA: 1.0000 | TOPICAL_CREAM | CUTANEOUS | 0 refills | Status: AC | PRN

## 2023-05-19 NOTE — Progress Notes (Signed)
EMLA cream ordered to apply topically at area of pain.

## 2023-06-20 ENCOUNTER — Encounter: Payer: BC Managed Care – PPO | Admitting: Obstetrics and Gynecology

## 2023-06-22 ENCOUNTER — Encounter: Payer: Self-pay | Admitting: Obstetrics and Gynecology

## 2023-06-22 ENCOUNTER — Ambulatory Visit (INDEPENDENT_AMBULATORY_CARE_PROVIDER_SITE_OTHER): Payer: BC Managed Care – PPO | Admitting: Obstetrics and Gynecology

## 2023-06-22 VITALS — BP 110/73 | HR 73

## 2023-06-22 DIAGNOSIS — Z9889 Other specified postprocedural states: Secondary | ICD-10-CM

## 2023-06-22 DIAGNOSIS — Z48816 Encounter for surgical aftercare following surgery on the genitourinary system: Secondary | ICD-10-CM

## 2023-06-22 NOTE — Progress Notes (Signed)
[facility]  Date of Visit: 06/22/2023  History of Present Illness: Leah Khan is a 57 y.o. female scheduled today for a post-operative visit.   Surgery: s/p Posterior repair, perineorrhaphy, sacrospinous ligament fixation on 05/09/23  She passed her postoperative void trial.   Postoperative course has been uncomplicated.   Had some pain initially after surgery but it has mostly improved. Still has some discomfort at the opening of the vagina.   UTI in the last 6 weeks? No  Pain? Yes  She has returned to her normal activity (except for postop restrictions) Vaginal bulge? No  Stress incontinence: No  Urgency/frequency: No  Urge incontinence: No  Voiding dysfunction: No  Bowel issues: No   Subjective Success: Do you usually have a bulge or something falling out that you can see or feel in the vaginal area? No  Retreatment Success: Any retreatment with surgery or pessary for any compartment? No    Medications: She has a current medication list which includes the following prescription(s): acetaminophen, amlodipine, estradiol, estradiol, gabapentin, ibuprofen, lidocaine-prilocaine, linzess, lorazepam, multivitamin, oxycodone, oxycodone, polyethylene glycol powder, and topiramate.   Allergies: Patient is allergic to ace inhibitors, amitriptyline, sertraline hcl, and latex.   Physical Exam: BP 110/73   Pulse 73    Pelvic Examination: Perineal incision healing well. Tenderness present at the stitch at the introitus, small spot of blood present with no active bleeding or granulation tissue. Vagina: Incisions healing well. Sutures are present at incision line and there is not granulation tissue. No tenderness along the anterior or posterior vagina. No apical tenderness. No pelvic masses.   POP-Q: POP-Q  -3                                            Aa   -3                                           Ba  -6.5                                              C   3.5                                             Gh  2.5                                            Pb  7                                            tvl   -3  Ap  -3                                            Bp                                                 D     ---------------------------------------------------------  Assessment and Plan:  1. Post-operative state     - Healing well overall. Advised to restart the vaginal estrace cream nightly for two weeks then twice a week after to promote healing. Will plan to reexamine in a month to ensure full healing.  - Can resume regular activity including exercise. Wait for intercourse until after next visit - Discussed avoidance of heavy lifting and straining long term to reduce the risk of recurrence. Can continue with miralax as needed to prevent constipation.   Marguerita Beards, MD

## 2023-07-22 ENCOUNTER — Ambulatory Visit (INDEPENDENT_AMBULATORY_CARE_PROVIDER_SITE_OTHER): Payer: 59 | Admitting: Obstetrics and Gynecology

## 2023-07-22 ENCOUNTER — Encounter: Payer: Self-pay | Admitting: Obstetrics and Gynecology

## 2023-07-22 VITALS — BP 94/62 | HR 78

## 2023-07-22 DIAGNOSIS — Z48816 Encounter for surgical aftercare following surgery on the genitourinary system: Secondary | ICD-10-CM

## 2023-07-22 DIAGNOSIS — Z9889 Other specified postprocedural states: Secondary | ICD-10-CM

## 2023-07-22 NOTE — Progress Notes (Signed)
 [facility]  Date of Visit: 07/22/2023  History of Present Illness: Ms. Fines is a 58 y.o. female scheduled today for a post-operative visit.   Surgery: s/p Posterior repair, perineorrhaphy, sacrospinous ligament fixation on 05/09/23  She passed her postoperative void trial.   Postoperative course has been uncomplicated.   No longer having pain or discomfort. She has been using the estrogen cream twice a week.   Medications: She has a current medication list which includes the following prescription(s): acetaminophen , amlodipine, estradiol , estradiol , gabapentin , ibuprofen , lidocaine -prilocaine , linzess, lorazepam, multivitamin, oxycodone , oxycodone , polyethylene glycol powder, and topiramate.   Allergies: Patient is allergic to ace inhibitors, amitriptyline, sertraline hcl, and latex.   Physical Exam: BP 94/62   Pulse 78    Pelvic Examination: Perineal incision well healed, no tenderness.  Vagina: Well healed, no sutures present.  No tenderness along the anterior or posterior vagina.  POP-Q: POP-Q  -3                                            Aa   -3                                           Ba  -6.5                                              C   2                                            Gh  3                                            Pb  7                                            tvl   -3                                            Ap  -3                                            Bp                                                 D     ---------------------------------------------------------  Assessment and Plan:  1. Post-operative state     - Well healed - OK to resume  intercourse - Discussed avoidance of heavy lifting and straining long term to reduce the risk of recurrence.   Follow up as needed  Rosaline LOISE Caper, MD

## 2023-10-19 ENCOUNTER — Other Ambulatory Visit: Payer: Self-pay

## 2023-10-19 ENCOUNTER — Encounter (HOSPITAL_COMMUNITY): Payer: Self-pay

## 2023-10-19 ENCOUNTER — Emergency Department (HOSPITAL_COMMUNITY)
Admission: EM | Admit: 2023-10-19 | Discharge: 2023-10-20 | Disposition: A | Discharge status: Home / Self Care | Attending: Emergency Medicine | Admitting: Emergency Medicine

## 2023-10-19 ENCOUNTER — Emergency Department (HOSPITAL_COMMUNITY)

## 2023-10-19 DIAGNOSIS — N200 Calculus of kidney: Secondary | ICD-10-CM

## 2023-10-19 DIAGNOSIS — K3189 Other diseases of stomach and duodenum: Secondary | ICD-10-CM

## 2023-10-19 DIAGNOSIS — R1031 Right lower quadrant pain: Secondary | ICD-10-CM | POA: Diagnosis present

## 2023-10-19 DIAGNOSIS — Z91040 Latex allergy status: Secondary | ICD-10-CM | POA: Insufficient documentation

## 2023-10-19 DIAGNOSIS — N132 Hydronephrosis with renal and ureteral calculous obstruction: Secondary | ICD-10-CM | POA: Diagnosis not present

## 2023-10-19 LAB — CBC
HCT: 42.3 % (ref 36.0–46.0)
Hemoglobin: 13.2 g/dL (ref 12.0–15.0)
MCH: 27.6 pg (ref 26.0–34.0)
MCHC: 31.2 g/dL (ref 30.0–36.0)
MCV: 88.3 fL (ref 80.0–100.0)
Platelets: 299 10*3/uL (ref 150–400)
RBC: 4.79 MIL/uL (ref 3.87–5.11)
RDW: 13.7 % (ref 11.5–15.5)
WBC: 10.4 10*3/uL (ref 4.0–10.5)
nRBC: 0 % (ref 0.0–0.2)

## 2023-10-19 LAB — URINALYSIS, ROUTINE W REFLEX MICROSCOPIC
Bacteria, UA: NONE SEEN
Bilirubin Urine: NEGATIVE
Glucose, UA: NEGATIVE mg/dL
Ketones, ur: NEGATIVE mg/dL
Nitrite: NEGATIVE
Protein, ur: NEGATIVE mg/dL
RBC / HPF: >50 RBC/hpf (ref 0–5)
Specific Gravity, Urine: >1.046 — ABNORMAL HIGH (ref 1.005–1.030)
pH: 7 (ref 5.0–8.0)

## 2023-10-19 LAB — COMPREHENSIVE METABOLIC PANEL WITH GFR
ALT: 13 U/L (ref 0–44)
AST: 21 U/L (ref 15–41)
Albumin: 4.3 g/dL (ref 3.5–5.0)
Alkaline Phosphatase: 57 U/L (ref 38–126)
Anion gap: 7 (ref 5–15)
BUN: 16 mg/dL (ref 6–20)
CO2: 24 mmol/L (ref 22–32)
Calcium: 9 mg/dL (ref 8.9–10.3)
Chloride: 107 mmol/L (ref 98–111)
Creatinine, Ser: 1.06 mg/dL — ABNORMAL HIGH (ref 0.44–1.00)
GFR, Estimated: >60 mL/min (ref >60)
Glucose, Bld: 124 mg/dL — ABNORMAL HIGH (ref 70–99)
Potassium: 3.3 mmol/L — ABNORMAL LOW (ref 3.5–5.1)
Sodium: 138 mmol/L (ref 135–145)
Total Bilirubin: 0.4 mg/dL (ref 0.0–1.2)
Total Protein: 7.6 g/dL (ref 6.5–8.1)

## 2023-10-19 LAB — LIPASE, BLOOD: Lipase: 36 U/L (ref 11–51)

## 2023-10-19 MED ORDER — ONDANSETRON HCL 4 MG/2ML IJ SOLN
4.0000 mg | Freq: Once | INTRAMUSCULAR | Status: AC
  Administered 2023-10-19: 4 mg via INTRAVENOUS
  Filled 2023-10-19: qty 2

## 2023-10-19 MED ORDER — IOHEXOL 300 MG/ML  SOLN
100.0000 mL | Freq: Once | INTRAMUSCULAR | Status: AC | PRN
Start: 2023-10-19 — End: 2023-10-19
  Administered 2023-10-19: 100 mL via INTRAVENOUS

## 2023-10-19 MED ORDER — OXYCODONE HCL 5 MG PO TABS: 5.0000 mg | ORAL_TABLET | Freq: Four times a day (QID) | ORAL | 0 refills | Status: AC | PRN

## 2023-10-19 MED ORDER — TAMSULOSIN HCL 0.4 MG PO CAPS: 0.4000 mg | ORAL_CAPSULE | Freq: Every day | ORAL | 0 refills | Status: AC

## 2023-10-19 MED ORDER — MORPHINE SULFATE (PF) 4 MG/ML IV SOLN
4.0000 mg | Freq: Once | INTRAVENOUS | Status: AC
  Administered 2023-10-19: 4 mg via INTRAVENOUS
  Filled 2023-10-19: qty 1

## 2023-10-19 MED ORDER — KETOROLAC TROMETHAMINE 15 MG/ML IJ SOLN
15.0000 mg | Freq: Once | INTRAMUSCULAR | Status: AC
  Administered 2023-10-19: 15 mg via INTRAVENOUS
  Filled 2023-10-19: qty 1

## 2023-10-19 MED ORDER — ONDANSETRON HCL 4 MG PO TABS: 4.0000 mg | ORAL_TABLET | Freq: Four times a day (QID) | ORAL | 0 refills | Status: AC

## 2023-10-19 NOTE — ED Provider Triage Note (Signed)
 Emergency Medicine Provider Triage Evaluation Note  Leah Khan , a 58 y.o. female  was evaluated in triage.  Pt complains of right lower quadrant abdominal pain.  Pt seen and sent here for concern of appendicitis  Review of Systems  Positive: Lower abdominal pain Negative: fever  Physical Exam  BP (!) 149/105 (BP Location: Right Arm)   Pulse 73   Temp 98.4 F (36.9 C) (Oral)   Resp 19   Ht 5\' 5"  (1.651 m)   Wt 61.7 kg   SpO2 100%   BMI 22.64 kg/m  Gen:   Awake, no distress   Resp:  Normal effort  MSK:   Moves extremities without difficulty  Other:  Tender right lower quadrant  Medical Decision Making  Medically screening exam initiated at 5:47 PM.  Appropriate orders placed.  CORY RAMA was informed that the remainder of the evaluation will be completed by another provider, this initial triage assessment does not replace that evaluation, and the importance of remaining in the ED until their evaluation is complete.     Elson Areas, [place] 10/19/23 1749

## 2023-10-19 NOTE — ED Provider Notes (Signed)
 [place] [facility] AT [facility] Provider Note   CSN: [id number] Arrival date & time: 10/19/23  1656     History {Add pertinent medical, surgical, social history, OB history to HPI:1} Chief Complaint  Patient presents with  . Abdominal Pain    Leah Khan is a 58 y.o. female status post directly presented with right lower quadrant pain that began earlier this afternoon.  Patient was resting when she first noticed the pain.  Patient had nausea and vomiting with this but denies any fevers.  Patient states she has chills.  Patient denies any urinary symptoms, constipation, diarrhea, recent illnesses, sick contacts, recent travel.  Patient states she still is her appendix.  Home Medications Prior to Admission medications   Medication Sig Start Date End Date Taking? Authorizing Provider  acetaminophen (TYLENOL) 500 MG tablet Take 1 tablet (500 mg total) by mouth every 6 (six) hours as needed (pain). 01/31/23   Selmer Dominion, NP  amLODipine (NORVASC) 5 MG tablet Take 5 mg by mouth at bedtime. 04/24/15   [provider]  estradiol (ESTRACE) 0.1 MG/GM vaginal cream Place 0.5g nightly for two weeks then twice a week after Patient taking differently: Place 1 Applicatorful vaginally 2 (two) times a week. Place 0.5g nightly for two weeks then twice a week after 08/30/22   Marguerita Beards, MD  estradiol (ESTRACE) 1 MG tablet Take 1 mg by mouth every 3 (three) days.    [provider]  gabapentin (NEURONTIN) 100 MG capsule Take 1 capsule (100 mg total) by mouth 3 (three) times daily. 05/10/23   Marguerita Beards, MD  ibuprofen (ADVIL) 600 MG tablet Take 1 tablet (600 mg total) by mouth every 6 (six) hours as needed. Patient not taking: Reported on 04/25/2023 01/31/23   Selmer Dominion, NP  lidocaine-prilocaine (EMLA) cream Apply 1 Application topically as needed. Apply to vaginal opening 05/19/23   Selmer Dominion, NP  LINZESS 72 MCG capsule  1 capsule at least 30 minutes before the first meal of the day on an empty stomach Orally Once a day for 30 days 04/21/23   [provider]  LORazepam (ATIVAN) 1 MG tablet 1/2-1 tablet at bedtime as needed Orally Once a day 04/21/23   [provider]  Multiple Vitamin (MULTIVITAMIN) tablet Take 1 tablet by mouth at bedtime.    [provider]  oxyCODONE (OXY IR/ROXICODONE) 5 MG immediate release tablet Take 1 tablet (5 mg total) by mouth every 4 (four) hours as needed for severe pain. Patient not taking: Reported on 04/25/2023 01/31/23   Selmer Dominion, NP  oxyCODONE (OXY IR/ROXICODONE) 5 MG immediate release tablet Take 1 tablet (5 mg total) by mouth every 4 (four) hours as needed for severe pain (pain score 7-10). 05/10/23   Marguerita Beards, MD  polyethylene glycol powder (GLYCOLAX/MIRALAX) 17 GM/SCOOP powder Take 17 g by mouth daily. Drink 17g (1 scoop) dissolved in water per day. Patient not taking: Reported on 04/25/2023 01/31/23   Selmer Dominion, NP  topiramate (TOPAMAX) 100 MG tablet Take 100 mg by mouth at bedtime. 02/01/15   [provider]      Allergies    Ace inhibitors, Amitriptyline, Sertraline hcl, and Latex    Review of Systems   Review of Systems  Gastrointestinal:  Positive for abdominal pain.    Physical Exam Updated Vital Signs BP 126/76   Pulse 66   Temp 97.9 F (36.6 C) (Oral)   Resp 20  Ht 5\' 5"  (1.651 m)   Wt 61.7 kg   SpO2 99%   BMI 22.64 kg/m  Physical Exam  ED Results / Procedures / Treatments   Labs (all labs ordered are listed, but only abnormal results are displayed) Labs Reviewed  COMPREHENSIVE METABOLIC PANEL WITH GFR - Abnormal; Notable for the following components:      Result Value   Potassium 3.3 (*)    Glucose, Bld 124 (*)    Creatinine, Ser 1.06 (*)    All other components within normal limits  LIPASE, BLOOD  CBC  URINALYSIS, ROUTINE W REFLEX MICROSCOPIC    EKG None  Radiology No  results found.  Procedures Procedures  {Document cardiac monitor, telemetry assessment procedure when appropriate:1}  Medications Ordered in ED Medications  morphine (PF) 4 MG/ML injection 4 mg (4 mg Intravenous Given 10/19/23 1815)  ondansetron (ZOFRAN) injection 4 mg (4 mg Intravenous Given 10/19/23 1815)  iohexol (OMNIPAQUE) 300 MG/ML solution 100 mL (100 mLs Intravenous Contrast Given 10/19/23 1859)  morphine (PF) 4 MG/ML injection 4 mg (4 mg Intravenous Given 10/19/23 1948)    ED Course/ Medical Decision Making/ A&P   {   Click here for ABCD2, HEART and other calculatorsREFRESH Note before signing :1}                              Medical Decision Making Amount and/or Complexity of Data Reviewed Labs: ordered.  Risk Prescription drug management.   Leah Khan 58 y.o. presented today for abdominal pain.  Working DDx that I considered at this time includes, but not limited to, gastroenteritis, colitis, small bowel obstruction, appendicitis, cholecystitis, hepatobiliary pathology, gastritis, PUD, ACS, aortic dissection, diverticulosis/diverticulitis, pancreatitis, nephrolithiasis, medication induced, AAA, UTI, pyelonephritis, ***ruptured ectopic pregnancy, PID, ovarian/***testicular torsion.  R/o DDx: ***: These are considered less likely due to history of present illness, physical exam, labs/imaging findings.  Review of prior external notes: ***  Unique Tests and My Independent Interpretation:  CBC with differential: *** CMP: *** Lipase: *** UA: *** Urine Pregnancy: *** EKG: Rate, rhythm, axis, intervals all examined and without medically relevant abnormality. ST segments without concerns for elevations ***CT Abd/Pelvis with contrast: ***  ***Ultrasound: ***  Social Determinants of Health: {social:31487}  Discussion with Independent Historian: {historian:29369}  Discussion of Management of Tests: {historian:29369}  Risk: {Risk:29370}  Risk Stratification Score:  ***  Staffed with ***   Plan: On exam patient was no acute distress with stable vitals.  Patient does have right lower quadrant tenderness without peritoneal signs on exam.  Patient states she still has her appendix and is concerned for appendicitis and so we will get labs and imaging.  Labs are ultimately reassuring however CT scan shows kidney stone causing patient's pain.  I discussed with the patient that she will need to follow-up with urology as she is successfully p.o. challenged and will prescribe her Flomax along with oxycodone, Zofran to help with her symptoms.  I discussed with the patient and her husband side effects of these medications in the meantime she can take Tylenol or ibuprofen every 6 hours needed for pain however can use the oxycodone afterwards if this does not help.  I discussed not driving after using the oxycodone as it may make her drowsy.  CT scan also showed a gastric mass that we will need GI to follow-up on for an endoscopy to which patient was made aware of and verbalized her understanding and acceptance  of this.  At this time patient's pain is controlled and patient successfully p.o. challenged and safe to be discharged.  Patient was given return precautions. Patient stable for discharge at this time.  Patient verbalized understanding of plan.  This chart was dictated using voice recognition software.  Despite best efforts to proofread,  errors can occur which can change the documentation meaning.   {Document critical care time when appropriate:1} {Document review of labs and clinical decision tools ie heart score, Chads2Vasc2 etc:1}  {Document your independent review of radiology images, and any outside records:1} {Document your discussion with family members, caretakers, and with consultants:1} {Document social determinants of health affecting pt's care:1} {Document your decision making why or why not admission, treatments were needed:1} Final Clinical  Impression(s) / ED Diagnoses Final diagnoses:  None    Rx / DC Orders ED Discharge Orders     None

## 2023-10-19 NOTE — ED Triage Notes (Signed)
 Pt arrived reporting RLQ pain x1day. Upon assessment reports intermittent nausea. States she went to MD today and was sent to Ed to R/O appendicitis . Denies fever, chills, N/V or any other symptoms.

## 2023-10-19 NOTE — Discharge Instructions (Signed)
 Today your labs and imaging show you have a kidney stone causing your symptoms.  Your kidney stone is in the right mid ureter that is 4 mm and has a high likelihood of passing a turn.  The CT scan also showed a gastric mass that you will need to see GI for as you need an upper endoscopy to further assess this. Currently, the symptoms are under control, and so we can safely discharge you. Most of the stones pass on their own, and we need to just control the pain. You may take Tylenol or ibuprofen every 6 hours as needed for pain.  I have also prescribed you pain medication for pain not controlled by this. Please pick up the medications I have prescribed you, including the pain medicine. Call the Urologist for an appointment, if the pain continues - even if it is tolerable. Come to the ER if the pain is intolerable. Also come back to the ER if there are fevers, chills, inability to keep any fluids down, confusion, large blood clots passing.  Take Oxycodone as prescribed. Do not drink alcohol, drive or participate in any other potentially dangerous activities while taking this medication as it may make you sleepy. Do not take this medication with any other sedating medications, either prescription or over-the-counter. If you were prescribed Percocet or Vicodin, do not take these with acetaminophen (Tylenol) as it is already contained within these medications.   This medication is an opiate (or narcotic) pain medication and can be habit forming.  Use it as little as possible to achieve adequate pain control.  Do not use or use it with extreme caution if you have a history of opiate abuse or dependence.  If you are on a pain contract with your primary care doctor or a pain specialist, be sure to let them know you were prescribed this medication today from the [facility] Emergency Department.  This medication is intended for your use only - do not give any to anyone else and keep it in a secure place where  nobody else, especially children, have access to it.  It will also cause or worsen constipation, so you may want to consider taking an over-the-counter stool softener while you are taking this medication.

## 2024-02-24 ENCOUNTER — Other Ambulatory Visit: Payer: Self-pay | Admitting: Gastroenterology

## 2024-04-04 ENCOUNTER — Encounter (HOSPITAL_COMMUNITY): Payer: Self-pay | Admitting: Gastroenterology

## 2024-04-10 ENCOUNTER — Encounter (HOSPITAL_COMMUNITY): Payer: Self-pay | Admitting: Gastroenterology

## 2024-04-10 NOTE — Anesthesia Preprocedure Evaluation (Signed)
 Anesthesia Evaluation  Patient identified by MRN, date of birth, ID band Patient awake    Reviewed: Allergy & Precautions, NPO status , Patient's Chart, lab work & pertinent test results  Airway Mallampati: II  TM Distance: >3 FB     Dental  (+) Caps, Dental Advisory Given, Teeth Intact   Pulmonary neg pulmonary ROS   Pulmonary exam normal breath sounds clear to auscultation       Cardiovascular hypertension, Normal cardiovascular exam Rhythm:Regular Rate:Normal     Neuro/Psych  Headaches PSYCHIATRIC DISORDERS Anxiety     ADD   GI/Hepatic Neg liver ROS,,,Abnormal CT abdomen   Endo/Other  negative endocrine ROS    Renal/GU negative Renal ROS     Musculoskeletal  (+) Arthritis , Osteoarthritis,  DDD   Abdominal   Peds  Hematology negative hematology ROS (+)   Anesthesia Other Findings   Reproductive/Obstetrics                              Anesthesia Physical Anesthesia Plan  ASA: 2  Anesthesia Plan: MAC   Post-op Pain Management: Minimal or no pain anticipated   Induction: Intravenous  PONV Risk Score and Plan: 3 and Treatment may vary due to age or medical condition and Propofol  infusion  Airway Management Planned: Natural Airway, Simple Face Mask and Nasal Cannula  Additional Equipment: None  Intra-op Plan:   Post-operative Plan:   Informed Consent: I have reviewed the patients History and Physical, chart, labs and discussed the procedure including the risks, benefits and alternatives for the proposed anesthesia with the patient or authorized representative who has indicated his/her understanding and acceptance.     Dental advisory given  Plan Discussed with: CRNA and Anesthesiologist  Anesthesia Plan Comments:          Anesthesia Quick Evaluation

## 2024-04-11 ENCOUNTER — Ambulatory Visit (HOSPITAL_COMMUNITY)
Admission: RE | Admit: 2024-04-11 | Discharge: 2024-04-11 | Disposition: A | Discharge status: Home / Self Care | Attending: Gastroenterology | Admitting: Gastroenterology

## 2024-04-11 ENCOUNTER — Encounter (HOSPITAL_COMMUNITY)
Admission: RE | Disposition: A | Discharge status: Home / Self Care | Payer: Self-pay | Source: Home / Self Care | Attending: Gastroenterology

## 2024-04-11 ENCOUNTER — Ambulatory Visit (HOSPITAL_COMMUNITY): Payer: Self-pay | Admitting: Anesthesiology

## 2024-04-11 ENCOUNTER — Encounter (HOSPITAL_COMMUNITY): Payer: Self-pay | Admitting: Gastroenterology

## 2024-04-11 ENCOUNTER — Ambulatory Visit (HOSPITAL_BASED_OUTPATIENT_CLINIC_OR_DEPARTMENT_OTHER): Payer: Self-pay | Admitting: Anesthesiology

## 2024-04-11 ENCOUNTER — Other Ambulatory Visit: Payer: Self-pay

## 2024-04-11 DIAGNOSIS — I10 Essential (primary) hypertension: Secondary | ICD-10-CM

## 2024-04-11 DIAGNOSIS — K3189 Other diseases of stomach and duodenum: Secondary | ICD-10-CM

## 2024-04-11 DIAGNOSIS — F419 Anxiety disorder, unspecified: Secondary | ICD-10-CM | POA: Diagnosis not present

## 2024-04-11 DIAGNOSIS — M1990 Unspecified osteoarthritis, unspecified site: Secondary | ICD-10-CM | POA: Insufficient documentation

## 2024-04-11 HISTORY — PX: ESOPHAGOGASTRODUODENOSCOPY: SHX5428

## 2024-04-11 HISTORY — PX: EUS: SHX5427

## 2024-04-11 SURGERY — EGD (ESOPHAGOGASTRODUODENOSCOPY)
Anesthesia: Monitor Anesthesia Care

## 2024-04-11 MED ORDER — PROPOFOL 10 MG/ML IV BOLUS
INTRAVENOUS | Status: DC | PRN
  Administered 2024-04-11: 80 mg via INTRAVENOUS
  Administered 2024-04-11: 30 mg via INTRAVENOUS
  Administered 2024-04-11: 125 ug/kg/min via INTRAVENOUS

## 2024-04-11 MED ORDER — SODIUM CHLORIDE 0.9 % IV SOLN: INTRAVENOUS | Status: DC

## 2024-04-11 MED ORDER — LIDOCAINE 2% (20 MG/ML) 5 ML SYRINGE
INTRAMUSCULAR | Status: DC | PRN
  Administered 2024-04-11: 60 mg via INTRAVENOUS

## 2024-04-11 MED ORDER — ONDANSETRON HCL 4 MG/2ML IJ SOLN: 4.0000 mg | Freq: Once | INTRAMUSCULAR | Status: DC | PRN

## 2024-04-11 MED ORDER — FENTANYL CITRATE (PF) 100 MCG/2ML IJ SOLN: 25.0000 ug | INTRAMUSCULAR | Status: DC | PRN

## 2024-04-11 MED ORDER — SODIUM CHLORIDE 0.9 % IV SOLN
INTRAVENOUS | Status: AC | PRN
  Administered 2024-04-11: 500 mL via INTRAMUSCULAR

## 2024-04-11 MED ORDER — DEXMEDETOMIDINE HCL IN NACL 80 MCG/20ML IV SOLN
INTRAVENOUS | Status: AC
  Filled 2024-04-11: qty 20

## 2024-04-11 MED ORDER — PROPOFOL 1000 MG/100ML IV EMUL
INTRAVENOUS | Status: AC
  Filled 2024-04-11: qty 100

## 2024-04-11 MED ORDER — PROPOFOL 1000 MG/100ML IV EMUL
INTRAVENOUS | Status: AC
  Filled 2024-04-11: qty 200

## 2024-04-11 MED ORDER — DEXMEDETOMIDINE HCL IN NACL 80 MCG/20ML IV SOLN
INTRAVENOUS | Status: DC | PRN
  Administered 2024-04-11: 4 ug via INTRAVENOUS

## 2024-04-11 MED ORDER — DROPERIDOL 2.5 MG/ML IJ SOLN: 0.6250 mg | Freq: Once | INTRAMUSCULAR | Status: DC | PRN

## 2024-04-11 NOTE — Transfer of Care (Addendum)
 Immediate Anesthesia Transfer of Care Note  Patient: Leah Khan  Procedure(s) Performed: ULTRASOUND, UPPER GI TRACT, ENDOSCOPIC EGD (ESOPHAGOGASTRODUODENOSCOPY)  Patient Location: Endoscopy Unit  Anesthesia Type:MAC  Level of Consciousness: drowsy and patient cooperative  Airway & Oxygen Therapy: Patient Spontanous Breathing  Post-op Assessment: Report given to RN and Post -op Vital signs reviewed and stable  Post vital signs: Reviewed and stable  Last Vitals:  Vitals Value Taken Time  BP 103/76 04/11/24   08:55  Temp 36.3 04/11/24   08:56  Pulse 75 04/11/24 08:56  Resp 21 04/11/24 08:56  SpO2 98 % 04/11/24 08:56  Vitals shown include unfiled device data.  Last Pain:  Vitals:   04/11/24 0807  TempSrc: Temporal  PainSc: 0-No pain         Complications: No notable events documented.

## 2024-04-11 NOTE — Discharge Instructions (Signed)

## 2024-04-11 NOTE — H&P (Signed)
 [facility] H/P Note  Chief Complaint: gastric nodule  HPI: Leah Khan is an 58 y.o. female.  Submucosal-appearing gastric nodule on endoscopy (biopsies unremarkable).    Past Medical History:  Diagnosis Date   ADD (attention deficit disorder)    Anxiety    COVID 02/2023   Hypertension    Migraine    Prolapse of female pelvic organs     Past Surgical History:  Procedure Laterality Date   ABDOMINOPLASTY  02/2016   ANTERIOR AND POSTERIOR REPAIR WITH SACROSPINOUS FIXATION N/A 05/09/2023   Procedure: POSTERIOR REPAIR WITH PERINEORRHAPY AND SACROSPINOUS FIXATION;  Surgeon: Marilynne Rosaline SAILOR, MD;  Location: [facility];  Service: Gynecology;  Laterality: N/A;  Total time requested is 1.5 hrs   ANTERIOR CERVICAL DISCECTOMY  2021   per pt one level   BREAST REDUCTION SURGERY  1989   BUNIONECTOMY Right 1995   INCISIONAL HERNIA REPAIR  2018   SHOULDER ARTHROSCOPY WITH ROTATOR CUFF REPAIR AND SUBACROMIAL DECOMPRESSION Right 03/15/2018   Procedure: RIGHT SHOULDER ARTHROSCOPY WITH EXTENSIVE DEBRIDEMENT, ACROMIOPLASTY, ROTATOR CUFF REPAIR;  Surgeon: Jerri Kay HERO, MD;  Location: [facility];  Service: Orthopedics;  Laterality: Right;   SHOULDER ARTHROSCOPY WITH SUBACROMIAL DECOMPRESSION Right 09/13/2018   Procedure: RIGHT SHOULDER ARTHROSCOPY WITH LYSIS OF ADHESIONS, SUBACROMIAL DECOMPRESSION AND MANIPULATION UNDER ANESTHESIA;  Surgeon: Jerri Kay HERO, MD;  Location: [facility];  Service: Orthopedics;  Laterality: Right;   TONSILLECTOMY     child   TUBAL LIGATION Bilateral 1996   VAGINAL HYSTERECTOMY  1990    Medications Prior to Admission  Medication Sig Dispense Refill   amLODipine (NORVASC) 5 MG tablet Take 5 mg by mouth daily.     estradiol  (ESTRACE ) 1 MG tablet Take 1 mg by mouth every 3 (three) days.     LINZESS 72 MCG capsule 1 capsule at least 30 minutes before the first meal of the day on an empty stomach Orally Once a  day for 30 days     LORazepam (ATIVAN) 1 MG tablet 1/2-1 tablet at bedtime as needed Orally Once a day     ondansetron  (ZOFRAN ) 4 MG tablet Take 1 tablet (4 mg total) by mouth every 6 (six) hours. 12 tablet 0   topiramate (TOPAMAX) 100 MG tablet Take 100 mg by mouth at bedtime.  3   acetaminophen  (TYLENOL ) 500 MG tablet Take 1 tablet (500 mg total) by mouth every 6 (six) hours as needed (pain). 30 tablet 0   estradiol  (ESTRACE ) 0.1 MG/GM vaginal cream Place 0.5g nightly for two weeks then twice a week after (Patient taking differently: Place 1 Applicatorful vaginally 2 (two) times a week. Place 0.5g nightly for two weeks then twice a week after) 30 g 11   gabapentin  (NEURONTIN ) 100 MG capsule Take 1 capsule (100 mg total) by mouth 3 (three) times daily. (Patient not taking: Reported on 04/09/2024) 90 capsule 3   ibuprofen  (ADVIL ) 600 MG tablet Take 1 tablet (600 mg total) by mouth every 6 (six) hours as needed. (Patient not taking: Reported on 04/25/2023) 30 tablet 0   lidocaine -prilocaine  (EMLA ) cream Apply 1 Application topically as needed. Apply to vaginal opening 30 g 0   Multiple Vitamin (MULTIVITAMIN) tablet Take 1 tablet by mouth at bedtime.     polyethylene glycol powder (GLYCOLAX /MIRALAX ) 17 GM/SCOOP powder Take 17 g by mouth daily. Drink 17g (1 scoop) dissolved in water per day. (Patient not taking: Reported on 04/25/2023) 255 g 0    Allergies:  Allergies  Allergen Reactions   Ace Inhibitors Anaphylaxis    Angioedema  (lip swelling)   Amitriptyline     Irritability    Sertraline Hcl     Zombie feeling    Latex Itching and Rash    Family History  Problem Relation Age of Onset   Hypertension Mother    Diabetes Mother    Alcoholism Father    Diabetes Maternal Grandmother     Social History:  reports that she has never smoked. She has never used smokeless tobacco. She reports that she does not currently use alcohol. She reports that she does not use drugs.   ROS: As per HPI,  all others negative   Blood pressure (!) 134/97, pulse 71, temperature 97.7 F (36.5 C), temperature source Temporal, resp. rate 13, height 5' 5 (1.651 m), weight 60.3 kg, SpO2 100%. General appearance: NAD HEENT:  /AT, anicteric CV:  Normal rate LUNGS:  No visible distress ABD:  Soft, non-tender NEURO:  No encephalopathy.  No results found for this or any previous visit (from the past 48 hours). No results found.  Assessment/Plan   Gastric nodule. Upper endoscopy with ultrasound and possible fine needle aspiration. Risks (bleeding, infection, bowel perforation that could require surgery, sedation-related changes in cardiopulmonary systems), benefits (identification and possible treatment of source of symptoms, exclusion of certain causes of symptoms), and alternatives (watchful waiting, radiographic imaging studies, empiric medical treatment) of upper endoscopy with ultrasound and possible fine needle aspiration (EGD/EUS +/- FNA) were explained to patient/family in detail and patient wishes to proceed.   BURNETTE ELSIE HERO 04/11/2024, 8:23 AM

## 2024-04-11 NOTE — Anesthesia Procedure Notes (Signed)
 Date/Time: 04/11/2024 8:31 AM  Performed by: Para Jerelene CROME, CRNAOxygen Delivery Method: Nasal cannula Comments: OptiFlow Nasal Cannula

## 2024-04-11 NOTE — Anesthesia Postprocedure Evaluation (Signed)
 Anesthesia Post Note  Patient: Leah Khan  Procedure(s) Performed: ULTRASOUND, UPPER GI TRACT, ENDOSCOPIC EGD (ESOPHAGOGASTRODUODENOSCOPY)     Patient location during evaluation: PACU Anesthesia Type: MAC Level of consciousness: awake and alert and oriented Pain management: pain level controlled Vital Signs Assessment: post-procedure vital signs reviewed and stable Respiratory status: spontaneous breathing, nonlabored ventilation and respiratory function stable Cardiovascular status: stable and blood pressure returned to baseline Postop Assessment: no apparent nausea or vomiting Anesthetic complications: no   No notable events documented.  Last Vitals:  Vitals:   04/11/24 0910 04/11/24 0920  BP: 123/74 119/84  Pulse: 65 63  Resp: 15 16  Temp:    SpO2: 100% 100%    Last Pain:  Vitals:   04/11/24 0920  TempSrc:   PainSc: 0-No pain                 Landry Lookingbill A.

## 2024-04-11 NOTE — Op Note (Signed)
 [facility] Patient Name: Leah Khan Procedure Date: 04/11/2024 MRN: [id number] Attending MD: Elsie Cree , MD, [id number] Date of Birth: 04/18/1966 CSN: [id number] Age: 58 Admit Type: Outpatient Procedure:                Upper EUS Indications:              Gastric mucosal mass/polyp found on endoscopy Providers:                Elsie Cree, MD, Randall Lines, RN, Fairy Marina, Technician Referring MD:             Dr. Joen Gentry Medicines:                Monitored Anesthesia Care Complications:            No immediate complications. Estimated Blood Loss:     Estimated blood loss: none. Procedure:                Pre-Anesthesia Assessment:                           - Prior to the procedure, a History and Physical                            was performed, and patient medications and                            allergies were reviewed. The patient's tolerance of                            previous anesthesia was also reviewed. The risks                            and benefits of the procedure and the sedation                            options and risks were discussed with the patient.                            All questions were answered, and informed consent                            was obtained. Prior Anticoagulants: The patient has                            taken no anticoagulant or antiplatelet agents. ASA                            Grade Assessment: II - A patient with mild systemic                            disease. After reviewing the risks and benefits,  the patient was deemed in satisfactory condition to                            undergo the procedure.                           After obtaining informed consent, the endoscope was                            passed under direct vision. Throughout the                            procedure, the patient's blood pressure, pulse, and                             oxygen saturations were monitored continuously. The                            [id number]-[id number] ([id number]) Olympus endoscope was introduced                            through the mouth, and advanced to the body of the                            stomach. The [id number]-[id number] ([id number] Olympus                            endosonoscope was introduced through the mouth, and                            advanced to the body of the stomach. The upper EUS                            was accomplished without difficulty. The patient                            tolerated the procedure well. Scope In: Scope Out: Findings:      ENDOSCOPIC FINDING: :      A single submucosal papule (nodule) with no stigmata of recent bleeding       was found in the gastric fundus.      A medium amount of food (residue) was found in the gastric body and in       the gastric antrum.      ENDOSONOGRAPHIC FINDING: :      A round intramural (subepithelial) lesion was found in the fundus of the       stomach. The lesion was hypoechoic. Sonographically, the lesion appeared       to originate from the muscularis propria (Layer 4). The lesion also       measured 13 mm by 15 mm in diameter. The outer endosonographic borders       were well defined. Impression:               - A single submucosal papule (nodule) found in the  stomach.                           - A medium amount of food (residue) in the stomach.                           - An intramural (subepithelial) lesion was found in                            the fundus of the stomach. The lesion appeared to                            originate from within the muscularis propria (Layer                            4). Tissue has not been obtained. 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 No appreciable change in size since last                            endoscopy about 4 months ago. Stomach full of food;                            risks of aspiration during FNA outweigh benefits.                           - No specimens collected. Moderate Sedation:      None Recommendation:           - Discharge patient to home (via wheelchair).                           - Resume previous diet today.                           - Continue present medications.                           - Benign lack of worrisome EUS features and size <                            2 cm, would recommend repeat the upper endoscopic                            ultrasound in 1 year  for surveillance.                           - Return to GI clinic at appointment to be                            scheduled. Procedure Code(s):        --- Professional ---                           [phone], Esophagogastroduodenoscopy, flexible,                            transoral; with endoscopic ultrasound examination,                            including the esophagus, stomach, and either the                            duodenum or a surgically altered stomach where the                            jejunum is examined distal to the anastomosis Diagnosis Code(s):        --- Professional ---                           K31.89, Other diseases of stomach and duodenum CPT copyright 2022 [employer]. All rights reserved. The codes documented in this report are preliminary and upon coder review may  be revised to meet current compliance requirements. Elsie Cree, MD 04/11/2024 9:04:36 AM This report has been signed electronically. Number of Addenda: 0

## 2024-04-12 ENCOUNTER — Encounter (HOSPITAL_COMMUNITY): Payer: Self-pay | Admitting: Gastroenterology
